# Patient Record
Sex: Male | Born: 1960 | Race: White | Hispanic: No | Marital: Married | Smoking: Current every day smoker
Health system: Southern US, Community
[De-identification: ages and names within clinical notes are randomized; demographics above are authoritative.]

## PROBLEM LIST (undated history)

## (undated) DIAGNOSIS — E785 Hyperlipidemia, unspecified: Secondary | ICD-10-CM

## (undated) DIAGNOSIS — F419 Anxiety disorder, unspecified: Secondary | ICD-10-CM

## (undated) DIAGNOSIS — F1910 Other psychoactive substance abuse, uncomplicated: Secondary | ICD-10-CM

## (undated) DIAGNOSIS — N529 Male erectile dysfunction, unspecified: Secondary | ICD-10-CM

## (undated) DIAGNOSIS — M549 Dorsalgia, unspecified: Secondary | ICD-10-CM

## (undated) DIAGNOSIS — E230 Hypopituitarism: Secondary | ICD-10-CM

## (undated) DIAGNOSIS — K219 Gastro-esophageal reflux disease without esophagitis: Secondary | ICD-10-CM

## (undated) DIAGNOSIS — M25559 Pain in unspecified hip: Secondary | ICD-10-CM

## (undated) DIAGNOSIS — J189 Pneumonia, unspecified organism: Secondary | ICD-10-CM

## (undated) DIAGNOSIS — E039 Hypothyroidism, unspecified: Secondary | ICD-10-CM

## (undated) DIAGNOSIS — M1990 Unspecified osteoarthritis, unspecified site: Secondary | ICD-10-CM

## (undated) DIAGNOSIS — M797 Fibromyalgia: Secondary | ICD-10-CM

## (undated) DIAGNOSIS — R519 Headache, unspecified: Secondary | ICD-10-CM

## (undated) HISTORY — PX: COLONOSCOPY: SHX174

## (undated) HISTORY — PX: FRACTURE SURGERY: SHX138

## (undated) HISTORY — PX: OTHER SURGICAL HISTORY: SHX169

## (undated) HISTORY — DX: Dorsalgia, unspecified: M54.9

## (undated) HISTORY — DX: Pain in unspecified hip: M25.559

## (undated) HISTORY — PX: APPENDECTOMY: SHX54

## (undated) HISTORY — DX: Hypothyroidism, unspecified: E03.9

## (undated) HISTORY — DX: Hyperlipidemia, unspecified: E78.5

## (undated) HISTORY — DX: Hypopituitarism: E23.0

## (undated) HISTORY — DX: Male erectile dysfunction, unspecified: N52.9

---

## 2007-10-25 ENCOUNTER — Encounter (INDEPENDENT_AMBULATORY_CARE_PROVIDER_SITE_OTHER): Payer: Self-pay | Admitting: *Deleted

## 2007-10-25 ENCOUNTER — Encounter: Payer: Self-pay | Admitting: Family Medicine

## 2007-10-25 LAB — CONVERTED CEMR LAB
AST: 12 units/L
Albumin: 1.6 g/dL
BUN: 12 mg/dL
Chloride, Serum: 106 mmol/L
Cortisol, Plasma: 26.3 ug/dL
Creatinine, Ser: 1.13 mg/dL
Free T4: 1.1 ng/dL
Globulin: 2.4 g/dL
HDL: 25 mg/dL
Potassium, serum: 4.2 mmol/L
TSH: 3.97 microintl units/mL
Testosterone, Free: 320.2 ng/dL
Total Bilirubin: 0.3 mg/dL
Total Protein: 6.3 g/dL

## 2008-02-04 ENCOUNTER — Ambulatory Visit: Payer: Self-pay | Admitting: Family Medicine

## 2008-02-04 DIAGNOSIS — M549 Dorsalgia, unspecified: Secondary | ICD-10-CM | POA: Insufficient documentation

## 2008-02-04 DIAGNOSIS — E039 Hypothyroidism, unspecified: Secondary | ICD-10-CM | POA: Insufficient documentation

## 2008-02-04 DIAGNOSIS — E236 Other disorders of pituitary gland: Secondary | ICD-10-CM | POA: Insufficient documentation

## 2008-02-04 DIAGNOSIS — R42 Dizziness and giddiness: Secondary | ICD-10-CM

## 2008-02-06 ENCOUNTER — Encounter (INDEPENDENT_AMBULATORY_CARE_PROVIDER_SITE_OTHER): Payer: Self-pay | Admitting: *Deleted

## 2008-02-07 ENCOUNTER — Encounter (INDEPENDENT_AMBULATORY_CARE_PROVIDER_SITE_OTHER): Payer: Self-pay | Admitting: *Deleted

## 2008-02-11 ENCOUNTER — Telehealth (INDEPENDENT_AMBULATORY_CARE_PROVIDER_SITE_OTHER): Payer: Self-pay | Admitting: *Deleted

## 2008-02-12 ENCOUNTER — Telehealth (INDEPENDENT_AMBULATORY_CARE_PROVIDER_SITE_OTHER): Payer: Self-pay | Admitting: *Deleted

## 2008-02-26 ENCOUNTER — Ambulatory Visit: Payer: Self-pay | Admitting: Family Medicine

## 2008-02-26 DIAGNOSIS — L049 Acute lymphadenitis, unspecified: Secondary | ICD-10-CM

## 2008-03-04 ENCOUNTER — Encounter (INDEPENDENT_AMBULATORY_CARE_PROVIDER_SITE_OTHER): Payer: Self-pay | Admitting: *Deleted

## 2008-03-11 ENCOUNTER — Ambulatory Visit: Payer: Self-pay | Admitting: Family Medicine

## 2008-03-11 ENCOUNTER — Ambulatory Visit: Payer: Self-pay | Admitting: Cardiology

## 2008-03-11 DIAGNOSIS — R221 Localized swelling, mass and lump, neck: Secondary | ICD-10-CM

## 2008-03-11 DIAGNOSIS — R220 Localized swelling, mass and lump, head: Secondary | ICD-10-CM | POA: Insufficient documentation

## 2008-03-12 ENCOUNTER — Telehealth (INDEPENDENT_AMBULATORY_CARE_PROVIDER_SITE_OTHER): Payer: Self-pay | Admitting: *Deleted

## 2008-03-12 LAB — CONVERTED CEMR LAB
Basophils Relative: 0.7 % (ref 0.0–3.0)
CO2: 34 meq/L — ABNORMAL HIGH (ref 19–32)
Eosinophils Absolute: 0.4 10*3/uL (ref 0.0–0.7)
GFR calc Af Amer: 83 mL/min
Hemoglobin: 15.2 g/dL (ref 13.0–17.0)
MCHC: 34.4 g/dL (ref 30.0–36.0)
Monocytes Absolute: 0.8 10*3/uL (ref 0.1–1.0)
Neutrophils Relative %: 57.9 % (ref 43.0–77.0)
RBC: 5.17 M/uL (ref 4.22–5.81)
RDW: 13.6 % (ref 11.5–14.6)
Sed Rate: 16 mm/hr (ref 0–16)
TSH: 2.1 microintl units/mL (ref 0.35–5.50)
WBC: 9.7 10*3/uL (ref 4.5–10.5)

## 2008-03-17 ENCOUNTER — Encounter: Payer: Self-pay | Admitting: Family Medicine

## 2008-03-19 ENCOUNTER — Encounter: Payer: Self-pay | Admitting: Family Medicine

## 2008-03-24 ENCOUNTER — Encounter: Payer: Self-pay | Admitting: Family Medicine

## 2008-04-03 ENCOUNTER — Ambulatory Visit: Payer: Self-pay | Admitting: Family Medicine

## 2008-04-03 DIAGNOSIS — F17200 Nicotine dependence, unspecified, uncomplicated: Secondary | ICD-10-CM | POA: Insufficient documentation

## 2008-05-01 ENCOUNTER — Telehealth (INDEPENDENT_AMBULATORY_CARE_PROVIDER_SITE_OTHER): Payer: Self-pay | Admitting: *Deleted

## 2008-06-17 ENCOUNTER — Encounter (INDEPENDENT_AMBULATORY_CARE_PROVIDER_SITE_OTHER): Payer: Self-pay | Admitting: *Deleted

## 2008-06-17 ENCOUNTER — Ambulatory Visit: Payer: Self-pay | Admitting: Family Medicine

## 2008-06-17 DIAGNOSIS — G4700 Insomnia, unspecified: Secondary | ICD-10-CM

## 2008-06-18 ENCOUNTER — Encounter (INDEPENDENT_AMBULATORY_CARE_PROVIDER_SITE_OTHER): Payer: Self-pay | Admitting: *Deleted

## 2008-06-18 LAB — CONVERTED CEMR LAB
ALT: 20 units/L (ref 0–53)
AST: 21 units/L (ref 0–37)
Albumin: 3.9 g/dL (ref 3.5–5.2)
BUN: 15 mg/dL (ref 6–23)
Basophils Absolute: 0 10*3/uL (ref 0.0–0.1)
Basophils Relative: 0.1 % (ref 0.0–3.0)
Bilirubin, Direct: 0 mg/dL (ref 0.0–0.3)
Calcium: 9.4 mg/dL (ref 8.4–10.5)
Chloride: 101 meq/L (ref 96–112)
Cholesterol: 228 mg/dL — ABNORMAL HIGH (ref 0–200)
Direct LDL: 154.7 mg/dL
Eosinophils Absolute: 0.5 10*3/uL (ref 0.0–0.7)
Eosinophils Relative: 5.3 % — ABNORMAL HIGH (ref 0.0–5.0)
Glucose, Bld: 97 mg/dL (ref 70–99)
HCT: 42.7 % (ref 39.0–52.0)
MCV: 84.5 fL (ref 78.0–100.0)
Monocytes Absolute: 1 10*3/uL (ref 0.1–1.0)
Monocytes Relative: 10.2 % (ref 3.0–12.0)
Neutrophils Relative %: 50 % (ref 43.0–77.0)
Platelets: 249 10*3/uL (ref 150.0–400.0)
Potassium: 4.7 meq/L (ref 3.5–5.1)
Total Bilirubin: 0.6 mg/dL (ref 0.3–1.2)
Total Protein: 6.9 g/dL (ref 6.0–8.3)
VLDL: 40 mg/dL (ref 0.0–40.0)

## 2008-07-02 ENCOUNTER — Ambulatory Visit: Payer: Self-pay | Admitting: Gastroenterology

## 2008-07-14 ENCOUNTER — Ambulatory Visit: Payer: Self-pay | Admitting: Gastroenterology

## 2008-07-17 ENCOUNTER — Ambulatory Visit: Payer: Self-pay | Admitting: Family Medicine

## 2008-07-17 DIAGNOSIS — R21 Rash and other nonspecific skin eruption: Secondary | ICD-10-CM

## 2008-07-17 DIAGNOSIS — IMO0001 Reserved for inherently not codable concepts without codable children: Secondary | ICD-10-CM

## 2008-07-17 DIAGNOSIS — F329 Major depressive disorder, single episode, unspecified: Secondary | ICD-10-CM

## 2008-08-15 ENCOUNTER — Ambulatory Visit: Payer: Self-pay | Admitting: Family Medicine

## 2008-08-15 DIAGNOSIS — M2550 Pain in unspecified joint: Secondary | ICD-10-CM | POA: Insufficient documentation

## 2008-08-15 DIAGNOSIS — F528 Other sexual dysfunction not due to a substance or known physiological condition: Secondary | ICD-10-CM

## 2008-08-19 ENCOUNTER — Telehealth (INDEPENDENT_AMBULATORY_CARE_PROVIDER_SITE_OTHER): Payer: Self-pay | Admitting: *Deleted

## 2008-08-22 ENCOUNTER — Encounter: Payer: Self-pay | Admitting: Family Medicine

## 2008-09-22 ENCOUNTER — Emergency Department (HOSPITAL_COMMUNITY): Admission: EM | Admit: 2008-09-22 | Discharge: 2008-09-22 | Payer: Self-pay | Admitting: Emergency Medicine

## 2008-10-20 ENCOUNTER — Encounter: Payer: Self-pay | Admitting: Family Medicine

## 2008-11-17 ENCOUNTER — Ambulatory Visit: Payer: Self-pay | Admitting: Family Medicine

## 2008-11-18 ENCOUNTER — Telehealth: Payer: Self-pay | Admitting: Family Medicine

## 2008-11-21 ENCOUNTER — Telehealth: Payer: Self-pay | Admitting: Family Medicine

## 2008-12-19 ENCOUNTER — Encounter: Payer: Self-pay | Admitting: Family Medicine

## 2008-12-23 ENCOUNTER — Ambulatory Visit: Payer: Self-pay | Admitting: Internal Medicine

## 2008-12-30 ENCOUNTER — Encounter: Payer: Self-pay | Admitting: Internal Medicine

## 2008-12-30 ENCOUNTER — Encounter: Admission: RE | Admit: 2008-12-30 | Discharge: 2008-12-30 | Payer: Self-pay | Admitting: General Surgery

## 2009-01-07 ENCOUNTER — Encounter: Payer: Self-pay | Admitting: Family Medicine

## 2009-04-22 ENCOUNTER — Encounter (INDEPENDENT_AMBULATORY_CARE_PROVIDER_SITE_OTHER): Payer: Self-pay | Admitting: *Deleted

## 2009-04-22 ENCOUNTER — Ambulatory Visit: Payer: Self-pay | Admitting: Internal Medicine

## 2009-07-06 ENCOUNTER — Telehealth (INDEPENDENT_AMBULATORY_CARE_PROVIDER_SITE_OTHER): Payer: Self-pay | Admitting: *Deleted

## 2009-07-08 ENCOUNTER — Ambulatory Visit: Payer: Self-pay | Admitting: Family Medicine

## 2009-07-09 ENCOUNTER — Telehealth (INDEPENDENT_AMBULATORY_CARE_PROVIDER_SITE_OTHER): Payer: Self-pay | Admitting: *Deleted

## 2009-07-09 LAB — CONVERTED CEMR LAB
ALT: 16 units/L (ref 0–53)
Alkaline Phosphatase: 69 units/L (ref 39–117)
Bilirubin, Direct: 0 mg/dL (ref 0.0–0.3)
Chloride: 104 meq/L (ref 96–112)
Cholesterol: 228 mg/dL — ABNORMAL HIGH (ref 0–200)
Creatinine, Ser: 1.2 mg/dL (ref 0.4–1.5)
Eosinophils Absolute: 0.1 10*3/uL (ref 0.0–0.7)
Glucose, Bld: 84 mg/dL (ref 70–99)
HCT: 44.6 % (ref 39.0–52.0)
HDL: 27.9 mg/dL — ABNORMAL LOW (ref >39.00)
Lymphocytes Relative: 30.5 % (ref 12.0–46.0)
Neutro Abs: 6.4 10*3/uL (ref 1.4–7.7)
PSA: 1.63 ng/mL (ref 0.10–4.00)
RDW: 14.5 % (ref 11.5–14.6)
Total Bilirubin: 0.4 mg/dL (ref 0.3–1.2)
Total CHOL/HDL Ratio: 8
Total Protein: 6.9 g/dL (ref 6.0–8.3)
Triglycerides: 256 mg/dL — ABNORMAL HIGH (ref 0.0–149.0)
VLDL: 51.2 mg/dL — ABNORMAL HIGH (ref 0.0–40.0)
WBC: 10.2 10*3/uL (ref 4.5–10.5)

## 2009-08-04 ENCOUNTER — Encounter: Admission: RE | Admit: 2009-08-04 | Discharge: 2009-08-04 | Payer: Self-pay | Admitting: Anesthesiology

## 2009-08-04 ENCOUNTER — Encounter: Payer: Self-pay | Admitting: Family Medicine

## 2009-08-12 ENCOUNTER — Ambulatory Visit: Payer: Self-pay | Admitting: Family Medicine

## 2009-08-12 DIAGNOSIS — E785 Hyperlipidemia, unspecified: Secondary | ICD-10-CM

## 2009-08-27 ENCOUNTER — Telehealth (INDEPENDENT_AMBULATORY_CARE_PROVIDER_SITE_OTHER): Payer: Self-pay | Admitting: *Deleted

## 2009-11-19 ENCOUNTER — Encounter: Payer: Self-pay | Admitting: Family Medicine

## 2009-11-20 ENCOUNTER — Encounter: Payer: Self-pay | Admitting: Family Medicine

## 2010-03-23 NOTE — Progress Notes (Signed)
Summary: labs   Phone Note Outgoing Call   Call placed by: Doristine Devoid,  Jul 09, 2009 4:15 PM Call placed to: Patient Summary of Call: Pt's total cholesterol, LDL, triglycerides all elevated.  HDL is low.  needs to start Simvastatin 40mg  nightly and recheck LFTs in 6-8 weeks.   Follow-up for Phone Call        spoke w/ patient aware of labs and recommendations say he would like to have a chance to work on diet and exercise first informed him that I would send out cholesterol information to help him and we will see where he is at in 6 months........Marland KitchenDoristine Devoid  Jul 09, 2009 4:22 PM

## 2010-03-23 NOTE — Progress Notes (Signed)
Summary: due rov  Phone Note Outgoing Call Call back at [facility] Phone [phone]   Summary of Call: I refilled his cymbalta x 1 month only -- needs rov with Dr. Beverely Low.........Marland KitchenShary Decamp  Jul 06, 2009 9:24 AM   Follow-up for Phone Call        Patient is coming in on 5.18.11 Follow-up by: Harold Barban,  Jul 06, 2009 10:15 AM

## 2010-03-23 NOTE — Letter (Signed)
Summary: [place] No Show Letter  [facility] [place]  [address] [place], [place] [address]   Phone: [phone]  Fax: [phone]    04/22/2009 MRN: [id number]  [facility] [address] [place], [place]  [address]   Dear Mr. Dillon Singh,   Our records indicate that you missed your scheduled appointment with Dr Drue Novel on 04/22/09.  Please contact this office to reschedule your appointment as soon as possible.  It is important that you keep your scheduled appointments with your physician, so we can provide you the best care possible.  Please be advised that there may be a charge for "no show" appointments.    Sincerely,   [place] [facility]

## 2010-03-23 NOTE — Assessment & Plan Note (Signed)
Summary: cpx/cbs   Vital Signs:  Patient profile:   50 year old male Height:      72 inches Weight:      192 pounds BMI:     26.13 BSA:     2.10 Pulse rate:   76 / minute Pulse rhythm:   regular BP sitting:   128 / 74  (left arm) Cuff size:   regular  Vitals Entered By: Ok Anis CMA (August 12, 2009 8:24 AM) CC: CPX Comments Eye Exam Done six or eight weeks ago    History of Present Illness: 50 yo man here today for CPE.  1) Hyperlipidemia- has not started statin.  is in donut hole, fears he can't afford it.  'my cholesterol has always been high'.  aware of risk of MI/CVA  2) back/neck pain- had MRI last week, complains that L leg is 'completely numb'.  has seen multiple specialists and 'no one has answers'.  has been to [facility], docs in [place], [facility].  not sure what to do- very discouraged.  3) Tobacco use- still smoking 1 ppd.  aware he needs to quit but not interested at this time.  Preventive Screening-Counseling & Management  Alcohol-Tobacco     Alcohol drinks/day: 0     Smoking Status: current     Smoking Cessation Counseling: yes     Smoke Cessation Stage: contemplative     Packs/Day: 1.0  Caffeine-Diet-Exercise     Does Patient Exercise: no      Sexual History:  currently monogamous.        Drug Use:  never.    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 Take 6 Tablets Daily 2)  Cymbalta 60 Mg Cpep (Duloxetine Hcl) .... Take Two Tablets Daily 3)  Advil 200 Mg Tabs (Ibuprofen) .... As Needed 4)  Androgel Pump 1 % Gel (Testosterone) .... Use Twice Daily 5)  Soma 350 Mg Tabs (Carisoprodol) .Marland Kitchen.. 1 Tab By Mouth Three Times A Day 6)  Cialis 10 Mg Tabs (Tadalafil) .Marland Kitchen.. 1 Tab By Mouth As Needed.  Disp 1 Month Supply 7)  Halobetasol Propionate 0.05 % Crea (Halobetasol Propionate) .... Daily 8)  Levothyroxine Sodium 100 Mcg Tabs (Levothyroxine Sodium) .... Take One Tablet Daily 9)  Pravastatin Sodium 40 Mg  Tabs (Pravastatin Sodium) .... 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  The patient denies anorexia, fever, weight loss, weight gain, vision loss, decreased hearing, hoarseness, chest pain, syncope, dyspnea on exertion, peripheral edema, prolonged cough, headaches, abdominal pain, melena, hematochezia, severe indigestion/heartburn, hematuria, suspicious skin lesions, abnormal bleeding, enlarged lymph nodes, and testicular masses.    Physical Exam  General:  alert and well-developed.   Head:  Normocephalic and atraumatic without obvious abnormalities. No apparent alopecia or balding.  Eyes:  No corneal  or conjunctival inflammation noted. EOMI. Perrla. Funduscopic exam benign, without hemorrhages, exudates or papilledema. Vision grossly normal. Ears:  External ear exam shows no significant lesions or deformities.  Otoscopic examination reveals clear canals, tympanic membranes are intact bilaterally without bulging, retraction, inflammation or discharge. Hearing is grossly normal bilaterally. Nose:  External nasal examination shows no deformity or inflammation. Nasal mucosa are pink and moist without lesions or exudates. Mouth:  Oral mucosa and oropharynx without lesions or exudates.  Teeth in good repair. Neck:  No deformities, masses, or tenderness noted. Lungs:  Normal respiratory effort, chest expands symmetrically. Lungs are clear to auscultation, no crackles or wheezes. Heart:  Normal rate and regular rhythm. S1 and S2 normal without gallop, murmur, click, rub or other extra sounds. Abdomen:  Bowel sounds positive,abdomen soft and non-tender without masses, organomegaly or hernias noted. Rectal:  deferred at pt's request Genitalia:  Testes bilaterally descended without nodularity, tenderness or masses. No scrotal masses or lesions. No penis lesions or urethral discharge. Prostate:  deferred at pt's request Pulses:  +2 carotid, radial, DP Extremities:  No clubbing, cyanosis, edema, or deformity noted with normal full range of motion of all joints.   Neurologic:  No cranial nerve deficits noted. Station and gait are normal. Plantar reflexes are down-going bilaterally. DTRs are symmetrical throughout. motor and coordinative functions appear intact. Skin:  + erythema and hyperpigmentation of anterior shins bilaterally Cervical Nodes:  No lymphadenopathy noted Inguinal Nodes:  No significant adenopathy Psych:  Oriented X3, flat affect, and subdued.     Impression & Recommendations:  Problem # 1:  HEALTHY ADULT MALE (ICD-V70.0) Assessment Unchanged pt's PE WNL.  labs collected at previous  visit and reviewed w/ pt.  anticipatory guidance provided.  Problem # 2:  HYPERLIPIDEMIA (ICD-272.4) Assessment: New pt had not started simvastatin, feared he couldn't afford it.  start $4 pravastatin.  recheck LFTs in 6-8 weeks. His updated medication list for this problem includes:    Pravastatin Sodium 40 Mg Tabs (Pravastatin sodium) ..... One by mouth at bedtime  Problem # 3:  BACK PAIN, CHRONIC (ICD-724.5) Assessment: Unchanged offered pt referral to [facility] for another opinion on his back pain and current situation.  pt very frustrated that 'no one can find what's wrong w/ me'.  pt not interested in referral at this time b/c 'i can't take another person saying no'.  will let me know if he changes his mind. The following medications were removed from the medication list:    Fentanyl 75 Mcg/hr Pt72 (Fentanyl) .Marland Kitchen... Change every 2 days His updated medication list for this problem includes:    Oxycodone Hcl 30 Mg Tabs (Oxycodone hcl) .Marland Kitchen... Take 6 tablets daily    Advil 200 Mg Tabs (Ibuprofen) .Marland Kitchen... As needed    Soma 350 Mg Tabs (Carisoprodol) .Marland Kitchen... 1 tab by mouth three times a day  Problem # 4:  TOBACCO USE (ICD-305.1) Assessment: Unchanged strongly encouraged pt to quit.  will follow.  Complete  Medication List: 1)  Oxycodone Hcl 30 Mg Tabs (Oxycodone hcl) .... Take 6 tablets daily 2)  Cymbalta 60 Mg Cpep (Duloxetine hcl) .... Take two tablets daily 3)  Advil 200 Mg Tabs (Ibuprofen) .... As needed 4)  Androgel Pump 1 % Gel (Testosterone) .... Use twice daily 5)  Soma 350 Mg Tabs (Carisoprodol) .Marland Kitchen.. 1 tab by mouth three times a day 6)  Cialis 10 Mg Tabs (Tadalafil) .Marland Kitchen.. 1 tab by mouth as needed.  disp 1 month supply 7)  Halobetasol Propionate 0.05 % Crea (Halobetasol propionate) .... Daily 8)  Levothyroxine Sodium 100 Mcg Tabs (Levothyroxine sodium) .... Take one tablet daily 9)  Pravastatin Sodium 40 Mg Tabs (Pravastatin sodium) .... One by mouth at bedtime  Patient  Instructions: 1)  Please schedule a lab visit in 6-8 weeks to check your liver function 2)  Start the Pravastatin nightly before bed 3)  Let me know what you decide about [facility]- we can refer at any time 4)  Call with any questions or concerns 5)  Hang in there!! Prescriptions: PRAVASTATIN SODIUM 40 MG  TABS (PRAVASTATIN SODIUM) one by mouth at bedtime  #30 x 3   Entered and Authorized by:   Neena Rhymes MD   Signed by:   Neena Rhymes MD on 08/12/2009   Method used:   Print then Give to Patient   RxID:   [id number]

## 2010-03-23 NOTE — Miscellaneous (Signed)
Summary: Flu Vaccine / [facility]  Flu Vaccine / [facility]   Imported By: Lennie Odor 11/27/2009 12:07:19  _____________________________________________________________________  External Attachment:    Type:   Image     Comment:   External Document

## 2010-03-23 NOTE — Miscellaneous (Signed)
Summary: Flu vacc documentation  Clinical Lists Changes  Observations: Added new observation of FLU VAX: Historical (11/19/2009 9:20)      Immunization History:  Influenza Immunization History:    Influenza:  historical (11/19/2009) Flu vacc was given at [facility] on 11-19-09. Lucious Groves CMA  November 20, 2009 9:20 AM

## 2010-03-23 NOTE — Assessment & Plan Note (Signed)
Summary: roa from refill//[facility]   Vital Signs:  Patient profile:   50 year old male Height:      72 inches Weight:      191 pounds BMI:     26.00 Pulse rate:   70 / minute BP sitting:   118 / 80  (left arm)  Vitals Entered By: Doristine Devoid (Jul 08, 2009 11:03 AM) CC: roa and refill on meds    History of Present Illness: 50 yo man here today for f/u on  1) Hypothyroid- taking Levothyroxine daily.  following w/ Dr Talmage Nap (appt next month).  2) Depression- not in counseling, spends his days alone.  daughter is moving from [facility].  needs cymbalta refill.  denies SI/HI.  admits to concerns over what his constant pain is doing to his relationship w/ his wife.  3) ED- need Cialis refill.  meds still working for pt, gets occasional HAs.  4) Health maintainence- overdue for CPE.  needs labs.  Current Medications (verified): 1)  Oxycodone Hcl 30 Mg Tabs (Oxycodone Hcl) .... Take 6 Tablets Daily 2)  Cymbalta 60 Mg Cpep (Duloxetine Hcl) .... Take Two Tablets Daily 3)  Ibuprofen 600 Mg Tabs (Ibuprofen) .... Take As Needed 4)  Androgel Pump 1 % Gel (Testosterone) .... Use Twice Daily 5)  Soma 350 Mg Tabs (Carisoprodol) .Marland Kitchen.. 1 Tab By Mouth Three Times A Day 6)  Vitamin D 29528 Unit Caps (Ergocalciferol) .... Take One Tablet Weekly 7)  Cialis 10 Mg Tabs (Tadalafil) .Marland Kitchen.. 1 Tab By Mouth As Needed.  Disp 1 Month Supply 8)  Fentanyl 75 Mcg/hr Pt72 (Fentanyl) .... Change Every 2 Days 9)  Halobetasol Propionate 0.05 % Crea (Halobetasol Propionate) .... Daily 10)  Levothyroxine Sodium 100 Mcg Tabs (Levothyroxine Sodium) .... Take One Tablet Daily  Allergies (verified): No Known Drug Allergies  Past History:  Past Medical History: Last updated: 08/15/2008 Hypothyroidism Back Pain  Hip Pain hypopituitarism ED  Review of Systems      See HPI  Physical Exam  General:  alert and well-developed.   Psych:  Oriented X3, flat affect, and subdued.     Impression &  Recommendations:  Problem # 1:  HYPOTHYROIDISM (ICD-244.9) Assessment Unchanged following w/ Dr Talmage Nap.  requested notes. His updated medication list for this problem includes:    Levothyroxine Sodium 100 Mcg Tabs (Levothyroxine sodium) .Marland Kitchen... Take one tablet daily  Problem # 2:  DEPRESSIVE DISORDER (ICD-311) Assessment: Unchanged again stressed the importance of counseling- especially if pt feels he's burdening family.  denies SI/HI.  refill cymbalta, #s of therapists given. His updated medication list for this problem includes:    Cymbalta 60 Mg Cpep (Duloxetine hcl) .Marland Kitchen... Take two tablets daily  Problem # 3:  ERECTILE DYSFUNCTION (ICD-302.72) Assessment: Unchanged samples and refill given His updated medication list for this problem includes:    Cialis 10 Mg Tabs (Tadalafil) .Marland Kitchen... 1 tab by mouth as needed.  disp 1 month supply  Problem # 4:  HEALTHY ADULT MALE (ICD-V70.0) Assessment: Unchanged labs collected for upcoming CPE. Orders: Venipuncture (41324) TLB-Lipid Panel (80061-LIPID) TLB-BMP (Basic Metabolic Panel-BMET) (80048-METABOL) TLB-CBC Platelet - w/Differential (85025-CBCD) TLB-Hepatic/Liver Function Pnl (80076-HEPATIC) TLB-TSH (Thyroid Stimulating Hormone) (84443-TSH) TLB-PSA (Prostate Specific Antigen) (84153-PSA)  Complete Medication List: 1)  Oxycodone Hcl 30 Mg Tabs (Oxycodone hcl) .... Take 6 tablets daily 2)  Cymbalta 60 Mg Cpep (Duloxetine hcl) .... Take two tablets daily 3)  Ibuprofen 600 Mg Tabs (Ibuprofen) .... Take as needed 4)  Androgel Pump 1 %  Gel (Testosterone) .... Use twice daily 5)  Soma 350 Mg Tabs (Carisoprodol) .Marland Kitchen.. 1 tab by mouth three times a day 6)  Vitamin D 60454 Unit Caps (Ergocalciferol) .... Take one tablet weekly 7)  Cialis 10 Mg Tabs (Tadalafil) .Marland Kitchen.. 1 tab by mouth as needed.  disp 1 month supply 8)  Fentanyl 75 Mcg/hr Pt72 (Fentanyl) .... Change every 2 days 9)  Halobetasol Propionate 0.05 % Crea (Halobetasol propionate) ....  Daily 10)  Levothyroxine Sodium 100 Mcg Tabs (Levothyroxine sodium) .... Take one tablet daily  Patient Instructions: 1)  Please schedule your complete physical in the next 2-4 weeks, you can eat before this 2)  Consider calling and setting up counseling 3)  Please have Dr Talmage Nap send me a copy of her note 4)  Call with any questions or concerns 5)  Hang in there! Prescriptions: CIALIS 10 MG TABS (TADALAFIL) 1 tab by mouth as needed.  disp 1 month supply  #6 x 3   Entered and Authorized by:   Neena Rhymes MD   Signed by:   Neena Rhymes MD on 07/08/2009   Method used:   Electronically to        [phone]* (retail)       [address] [place], [place]  [address]       Ph: [phone]       Fax: [phone]   RxID:   [id number] CYMBALTA 60 MG CPEP (DULOXETINE HCL) take two tablets daily  #60 x 6   Entered and Authorized by:   Neena Rhymes MD   Signed by:   Neena Rhymes MD on 07/08/2009   Method used:   Electronically to        [phone]* (retail)       [address] [place], [place]  [address]       Ph: [phone]       Fax: [phone]   RxID:   [id number]

## 2010-03-23 NOTE — Progress Notes (Signed)
Summary: S/E of Pravastatin  Phone Note Call from Patient Call back at Home Phone [phone]   Caller: Patient Summary of Call: Patient called and left a VM on the triage line. He said he was started on Pravastatin and he is now experiencing headaches. He would like some advice on what to do. Please advise and send relpy to [facility] or nurse. Thanks! Initial call taken by: Harold Barban,  August 27, 2009 5:04 PM  Follow-up for Phone Call        HA is not a common side effect of pravastatin- may not be the cause of his HAs.  HAs could be allergy related, tension, migraine, etc.  can take 2-3 day break from med and see if it's related- if so, will need to switch meds, if not- will need appt to discuss HAs. Follow-up by: Neena Rhymes MD,  August 28, 2009 8:00 AM  Additional Follow-up for Phone Call Additional follow up Details #1::        spoke w/ patient aware of recommendations and will f/u to let us know how he is doing.......Marland KitchenDoristine Devoid  August 28, 2009 3:57 PM

## 2010-03-26 ENCOUNTER — Telehealth (INDEPENDENT_AMBULATORY_CARE_PROVIDER_SITE_OTHER): Payer: Self-pay | Admitting: *Deleted

## 2010-03-31 NOTE — Progress Notes (Signed)
Summary: Refill Request  Phone Note Refill Request Call back at [phone] Message from:  Pharmacy on March 26, 2010 11:15 AM  Refills Requested: Medication #1:  CYMBALTA 60 MG CPEP take two tablets daily   Dosage confirmed as above?Dosage Confirmed   Supply Requested: 60   Last Refilled: 02/21/2010   Notes: [facility]  Next Appointment Scheduled: none Initial call taken by: Harold Barban,  March 26, 2010 11:16 AM    Prescriptions: CYMBALTA 60 MG CPEP (DULOXETINE HCL) take two tablets daily  #60 x 6   Entered by:   Doristine Devoid CMA   Authorized by:   Neena Rhymes MD   Signed by:   Doristine Devoid CMA on 03/26/2010   Method used:   [facility]        [facility] [phone][facility] (retail)       [address] [place], [place]  [address]       Ph: [phone]       Fax: [phone]   RxID:   [id number]

## 2010-09-08 ENCOUNTER — Encounter: Payer: Self-pay | Admitting: Family Medicine

## 2010-09-13 ENCOUNTER — Ambulatory Visit (INDEPENDENT_AMBULATORY_CARE_PROVIDER_SITE_OTHER): Payer: 59 | Admitting: Family Medicine

## 2010-09-13 ENCOUNTER — Encounter: Payer: Self-pay | Admitting: Family Medicine

## 2010-09-13 DIAGNOSIS — L989 Disorder of the skin and subcutaneous tissue, unspecified: Secondary | ICD-10-CM

## 2010-09-13 DIAGNOSIS — F3289 Other specified depressive episodes: Secondary | ICD-10-CM

## 2010-09-13 DIAGNOSIS — F329 Major depressive disorder, single episode, unspecified: Secondary | ICD-10-CM

## 2010-09-13 DIAGNOSIS — E785 Hyperlipidemia, unspecified: Secondary | ICD-10-CM

## 2010-09-13 MED ORDER — VENLAFAXINE HCL ER 150 MG PO TB24: 1.0000 | ORAL_TABLET | Freq: Every day | ORAL | Status: DC

## 2010-09-13 NOTE — Assessment & Plan Note (Signed)
Pt has been off cholesterol med for ~1 yr due to headaches.  This is not typical side effect of statin.  Rather than restarting med will have pt scheduled CPE, check labs, and determine amount of reduction needed.  Pt expressed understanding and is in agreement w/ plan.

## 2010-09-13 NOTE — Assessment & Plan Note (Signed)
Pt's skin lesions are all benign appearing at this time.  Will continue to follow at future visits.  Reviewed abnormalities pt should be watchful of- change in shape, size, shading.  Pt aware.

## 2010-09-13 NOTE — Progress Notes (Signed)
  Subjective:    Patient ID: Dillon Singh, male    DOB: 11-01-1960, 50 y.o.   MRN: [id number]  HPI Hyperlipidemia- chronic problem for pt, was on Pravastatin for 2-3 weeks and noticed increased HAs.  Last took meds 1 yr ago.  Due for CPE.  Depression- has been on Cymbalta for 4-5 yrs, works well but is currently in the donut hole and meds cost $340.  Insurance is recommending switch to Effexor.  Moles- has 3 moles under L eye that are slowly raising.  Also has dark sports on ears bilaterally.  Worked in sun for many years.  Concerned about possible skin cancer   Review of Systems For ROS see HPI     Objective:   Physical Exam  Constitutional: He appears well-developed and well-nourished. No distress.  Cardiovascular: Normal rate, regular rhythm, normal heart sounds and intact distal pulses.   Pulmonary/Chest: Effort normal and breath sounds normal. No respiratory distress. He has no wheezes. He has no rales.  Skin: Skin is warm and dry.       Multiple smooth 'sun spots' (hyperpigmented areas) on external ears bilaterally 3 small nevi under L eye, none abnormally pigmented or shaped- none w/ melanotic features          Assessment & Plan:

## 2010-09-13 NOTE — Assessment & Plan Note (Signed)
Will attempt to switch pt from Cymbalta to Effexor due to insurance coverage.  If pt's sxs are not well controlled after switching, he's to call and we will attempt to get med via pt assistance.  Pt expressed understanding and is in agreement w/ plan.

## 2010-09-13 NOTE — Patient Instructions (Signed)
Please schedule your complete physical at your convenience- do not eat before this appt Your moles look fine but we will continue to monitor them Switch from the Cymbalta to the Effexor.  Call me if it is not effective and we'll figure something out. Call with any questions or concerns Have a great summer!

## 2011-01-26 ENCOUNTER — Ambulatory Visit: Payer: 59 | Attending: Anesthesiology | Admitting: Physical Therapy

## 2011-01-26 DIAGNOSIS — M25559 Pain in unspecified hip: Secondary | ICD-10-CM | POA: Insufficient documentation

## 2011-01-26 DIAGNOSIS — IMO0001 Reserved for inherently not codable concepts without codable children: Secondary | ICD-10-CM | POA: Insufficient documentation

## 2011-01-26 DIAGNOSIS — M545 Low back pain, unspecified: Secondary | ICD-10-CM | POA: Insufficient documentation

## 2011-01-26 DIAGNOSIS — M542 Cervicalgia: Secondary | ICD-10-CM | POA: Insufficient documentation

## 2011-02-02 ENCOUNTER — Ambulatory Visit: Payer: 59 | Admitting: Physical Therapy

## 2011-02-04 ENCOUNTER — Ambulatory Visit: Payer: 59 | Admitting: Physical Therapy

## 2011-02-07 ENCOUNTER — Ambulatory Visit: Payer: 59 | Admitting: Physical Therapy

## 2011-02-10 ENCOUNTER — Ambulatory Visit: Payer: 59 | Admitting: Physical Therapy

## 2011-02-16 ENCOUNTER — Ambulatory Visit: Payer: 59 | Admitting: Physical Therapy

## 2011-03-14 ENCOUNTER — Ambulatory Visit (INDEPENDENT_AMBULATORY_CARE_PROVIDER_SITE_OTHER): Payer: Medicare Other | Admitting: Family Medicine

## 2011-03-14 ENCOUNTER — Encounter: Payer: Self-pay | Admitting: Family Medicine

## 2011-03-14 VITALS — BP 120/76 | HR 88 | Temp 98.8°F | Wt 195.8 lb

## 2011-03-14 DIAGNOSIS — R52 Pain, unspecified: Secondary | ICD-10-CM

## 2011-03-14 DIAGNOSIS — E291 Testicular hypofunction: Secondary | ICD-10-CM

## 2011-03-14 DIAGNOSIS — D497 Neoplasm of unspecified behavior of endocrine glands and other parts of nervous system: Secondary | ICD-10-CM

## 2011-03-14 DIAGNOSIS — IMO0001 Reserved for inherently not codable concepts without codable children: Secondary | ICD-10-CM

## 2011-03-14 DIAGNOSIS — M791 Myalgia, unspecified site: Secondary | ICD-10-CM

## 2011-03-14 NOTE — Patient Instructions (Signed)
Myalgia, Adult Myalgia is the medical term for muscle pain. It is a symptom of many things. Nearly everyone at some time in their life has this. The most common cause for muscle pain is overuse or straining and more so when you are not in shape. Injuries and muscle bruises cause myalgias. Muscle pain without a history of injury can also be caused by a virus. It frequently comes along with the flu. Myalgia not caused by muscle strain can be present in a large number of infectious diseases. Some autoimmune diseases like lupus and fibromyalgia can cause muscle pain. Myalgia may be mild, or severe. SYMPTOMS  The symptoms of myalgia are simply muscle pain. Most of the time this is short lived and the pain goes away without treatment. DIAGNOSIS  Myalgia is diagnosed by your caregiver by taking your history. This means you tell him when the problems began, what they are, and what has been happening. If this has not been a long term problem, your caregiver may want to watch for a while to see what will happen. If it has been long term, they may want to do additional testing. TREATMENT  The treatment depends on what the underlying cause of the muscle pain is. Often anti-inflammatory medications will help. HOME CARE INSTRUCTIONS  If the pain in your muscles came from overuse, slow down your activities until the problems go away.   Myalgia from overuse of a muscle can be treated with alternating hot and cold packs on the muscle affected or with cold for the first couple days. If either heat or cold seems to make things worse, stop their use.   Apply ice to the sore area for 15 to 20 minutes, 3 to 4 times per day, while awake for the first 2 days of muscle soreness, or as directed. Put the ice in a plastic bag and place a towel between the bag of ice and your skin.   Only take over-the-counter or prescription medicines for pain, discomfort, or fever as directed by your caregiver.   Regular gentle exercise may  help if you are not active.   Stretching before strenuous exercise can help lower the risk of myalgia. It is normal when beginning an exercise regimen to feel some muscle pain after exercising. Muscles that have not been used frequently will be sore at first. If the pain is extreme, this may mean injury to a muscle.  SEEK MEDICAL CARE IF:  You have an increase in muscle pain that is not relieved with medication.   You begin to run a temperature.   You develop nausea and vomiting.   You develop a stiff and painful neck.   You develop a rash.   You develop muscle pain after a tick bite.   You have continued muscle pain while working out even after you are in good condition.  SEEK IMMEDIATE MEDICAL CARE IF: Any of your problems are getting worse and medications are not helping. MAKE SURE YOU:   Understand these instructions.   Will watch your condition.   Will get help right away if you are not doing well or get worse.  Document Released: 12/30/2005 Document Revised: 10/20/2010 Document Reviewed: 03/21/2006 [employer] Patient Information 2012 [employer]. 

## 2011-03-14 NOTE — Progress Notes (Signed)
  Subjective:    Patient ID: Dillon Singh, male    DOB: [birth year], 51 y.o.   MRN: [id number]  HPI Pt here c/o body aches for a long time ,  Scratchy throat.  No congestion, fever or chills.   Pt with hx parathyroid tumor  And low thyroid.  No other complaints.    Review of Systems As above    Objective:   Physical Exam  Constitutional: He is oriented to person, place, and time. He appears well-developed and well-nourished.  HENT:  Head: Normocephalic and atraumatic.  Right Ear: External ear normal.  Left Ear: External ear normal.  Neck: Normal range of motion. Neck supple.  Cardiovascular: Normal rate, regular rhythm, normal heart sounds and intact distal pulses.   No murmur heard. Pulmonary/Chest: Effort normal and breath sounds normal. No respiratory distress. He has no wheezes. He has no rales. He exhibits no tenderness.  Musculoskeletal: Normal range of motion. He exhibits no edema and no tenderness.  Neurological: He is alert and oriented to person, place, and time.  Psychiatric: He has a normal mood and affect. His behavior is normal. Judgment and thought content normal.          Assessment & Plan:  bodyaches----hold pravastatin for 2 weeks                           Check labs                      Keep cpe appointment with Dr Beverely Low

## 2011-03-15 LAB — TESTOSTERONE, FREE, TOTAL, SHBG
Testosterone, Free: 26.6 pg/mL — ABNORMAL LOW (ref 47.0–244.0)
Testosterone-% Free: 1.4 % — ABNORMAL LOW (ref 1.6–2.9)
Testosterone: 196.68 ng/dL — ABNORMAL LOW (ref 250–890)

## 2011-03-15 LAB — CBC WITH DIFFERENTIAL/PLATELET
Basophils Absolute: 0.2 10*3/uL — ABNORMAL HIGH (ref 0.0–0.1)
Basophils Relative: 2.1 % (ref 0.0–3.0)
Eosinophils Absolute: 0.6 10*3/uL (ref 0.0–0.7)
Eosinophils Relative: 7.5 % — ABNORMAL HIGH (ref 0.0–5.0)
Hemoglobin: 14.6 g/dL (ref 13.0–17.0)
Neutro Abs: 1.8 10*3/uL (ref 1.4–7.7)
WBC: 8.3 10*3/uL (ref 4.5–10.5)

## 2011-03-15 LAB — SEDIMENTATION RATE: Sed Rate: 67 mm/hr — ABNORMAL HIGH (ref 0–22)

## 2011-03-15 LAB — HEPATIC FUNCTION PANEL
AST: 54 U/L — ABNORMAL HIGH (ref 0–37)
Albumin: 3.6 g/dL (ref 3.5–5.2)
Alkaline Phosphatase: 67 U/L (ref 39–117)
Bilirubin, Direct: 0.1 mg/dL (ref 0.0–0.3)
Total Protein: 7.2 g/dL (ref 6.0–8.3)

## 2011-03-15 LAB — ANA: Anti Nuclear Antibody(ANA): NEGATIVE

## 2011-03-15 LAB — PTH, INTACT AND CALCIUM
Calcium, Total (PTH): 9.3 mg/dL (ref 8.4–10.5)
PTH: 39.5 pg/mL (ref 14.0–72.0)

## 2011-03-15 LAB — TSH: TSH: 0.45 u[IU]/mL (ref 0.35–5.50)

## 2011-03-16 ENCOUNTER — Other Ambulatory Visit: Payer: Medicare Other

## 2011-03-17 LAB — VITAMIN D 1,25 DIHYDROXY
Vitamin D 1, 25 (OH)2 Total: 34 pg/mL (ref 18–72)
Vitamin D3 1, 25 (OH)2: 34 pg/mL

## 2011-03-18 ENCOUNTER — Other Ambulatory Visit (INDEPENDENT_AMBULATORY_CARE_PROVIDER_SITE_OTHER): Payer: Medicare Other

## 2011-03-18 DIAGNOSIS — Z79899 Other long term (current) drug therapy: Secondary | ICD-10-CM

## 2011-03-18 DIAGNOSIS — Z0000 Encounter for general adult medical examination without abnormal findings: Secondary | ICD-10-CM

## 2011-03-18 LAB — HEPATIC FUNCTION PANEL
AST: 14 U/L (ref 0–37)
Total Bilirubin: 0.5 mg/dL (ref 0.3–1.2)
Total Protein: 7.1 g/dL (ref 6.0–8.3)

## 2011-03-18 LAB — GAMMA GT: GGT: 44 U/L (ref 7–51)

## 2011-03-21 ENCOUNTER — Encounter: Payer: Self-pay | Admitting: *Deleted

## 2011-03-30 ENCOUNTER — Ambulatory Visit (INDEPENDENT_AMBULATORY_CARE_PROVIDER_SITE_OTHER): Payer: Medicare Other | Admitting: Family Medicine

## 2011-03-30 ENCOUNTER — Encounter: Payer: Self-pay | Admitting: Family Medicine

## 2011-03-30 DIAGNOSIS — Z125 Encounter for screening for malignant neoplasm of prostate: Secondary | ICD-10-CM

## 2011-03-30 DIAGNOSIS — Z0000 Encounter for general adult medical examination without abnormal findings: Secondary | ICD-10-CM | POA: Insufficient documentation

## 2011-03-30 DIAGNOSIS — E785 Hyperlipidemia, unspecified: Secondary | ICD-10-CM

## 2011-03-30 LAB — LIPID PANEL
Total CHOL/HDL Ratio: 7
Triglycerides: 237 mg/dL — ABNORMAL HIGH (ref 0.0–149.0)
VLDL: 47.4 mg/dL — ABNORMAL HIGH (ref 0.0–40.0)

## 2011-03-30 LAB — PSA: PSA: 1.55 ng/mL (ref 0.10–4.00)

## 2011-03-30 MED ORDER — TADALAFIL 5 MG PO TABS: ORAL_TABLET | ORAL | Status: DC

## 2011-03-30 NOTE — Progress Notes (Signed)
  Subjective:    Patient ID: Dillon Singh, male    DOB: 11-09-1960, 51 y.o.   MRN: [id number]  HPI Here today for CPE.  Risk Factors: Hyperlipidemia- chronic problem, on pravastatin. Denies abd pain, N/V.   Physical Activity: limited physical activity due to joint pain Fall Risk: low risk, steady on feet Depression: chronic problem Hearing: normal to whispered voice ADL's: independent Cognitive: normal linear thought process, memory and attention intact Home Safety: safe at home, lives w/ wife Height, Weight, BMI, Visual Acuity: see vitals, vision corrected to 20/20 w/ glasses Counseling: UTD on colonoscopy, DEXA Labs Ordered: See A&P Care Plan: See A&P    Review of Systems Patient reports no vision/hearing changes, anorexia, fever ,adenopathy, persistant/recurrent hoarseness, swallowing issues, chest pain, palpitations, edema, persistant/recurrent cough, hemoptysis, dyspnea (rest,exertional, paroxysmal nocturnal), gastrointestinal  bleeding (melena, rectal bleeding), abdominal pain, excessive heart burn, GU symptoms (dysuria, hematuria, voiding/incontinence issues) syncope, focal weakness, memory loss, numbness & tingling, skin/hair/nail changes, abnormal bruising/bleeding.     Objective:   Physical Exam BP 108/80  Pulse 86  Temp(Src) 98.9 F (37.2 C) (Oral)  Ht 5' 11.5" (1.816 m)  Wt 195 lb 6.4 oz (88.633 kg)  BMI 26.87 kg/m2  SpO2 98%  General Appearance:    Alert, cooperative, no distress, appears stated age  Head:    Normocephalic, without obvious abnormality, atraumatic  Eyes:    PERRL, conjunctiva/corneas clear, EOM's intact, fundi    benign, both eyes       Ears:    Normal TM's and external ear canals, both ears  Nose:   Nares normal, septum midline, mucosa normal, no drainage   or sinus tenderness  Throat:   Lips, mucosa, and tongue normal; teeth and gums normal  Neck:   Supple, symmetrical, trachea midline, no adenopathy;       thyroid:  No  enlargement/tenderness/nodules  Back:     Symmetric, no curvature, ROM normal, no CVA tenderness  Lungs:     Clear to auscultation bilaterally, respirations unlabored  Chest wall:    No tenderness or deformity  Heart:    Regular rate and rhythm, S1 and S2 normal, no murmur, rub   or gallop  Abdomen:     Soft, non-tender, bowel sounds active all four quadrants,    no masses, no organomegaly  Genitalia:    Normal male without lesion, discharge or tenderness  Rectal:    Normal tone, normal prostate, no masses or tenderness  Extremities:   Extremities normal, atraumatic, no cyanosis or edema  Pulses:   2+ and symmetric all extremities  Skin:   Skin color, texture, turgor normal, no rashes or lesions  Lymph nodes:   Cervical, supraclavicular, and axillary nodes normal  Neurologic:   CNII-XII intact. Normal strength, sensation and reflexes      throughout          Assessment & Plan:

## 2011-03-30 NOTE — Patient Instructions (Signed)
Follow up in 6 months to recheck cholesterol We'll notify you of your lab results Call with any questions or concerns You look great!  Keep up the good work! Happy Valentine's Day and early birthday!

## 2011-03-31 MED ORDER — FENOFIBRATE 160 MG PO TABS: 160.0000 mg | ORAL_TABLET | Freq: Every day | ORAL | Status: DC

## 2011-04-06 NOTE — Assessment & Plan Note (Signed)
Pt's PE WNL.  UTD on health maintenance.  Reviewed labs from last visit, will order remaining labs.  Anticipatory guidance provided.

## 2011-04-06 NOTE — Assessment & Plan Note (Signed)
Chronic problem.  Tolerating statin w/out difficulty.  Due for labs.  Adjust meds prn. 

## 2011-04-15 ENCOUNTER — Telehealth: Payer: Self-pay | Admitting: *Deleted

## 2011-04-15 MED ORDER — TADALAFIL 5 MG PO TABS: ORAL_TABLET | ORAL | Status: DC

## 2011-04-15 NOTE — Telephone Encounter (Signed)
Pt left vm stating he did not receive his medication rx for cialis, rx sent to pharmacy by e-script Pt wife noted as designated party was seen in office today, alerted that his rx was sent as requested.

## 2011-04-22 ENCOUNTER — Telehealth: Payer: Self-pay | Admitting: *Deleted

## 2011-04-22 NOTE — Telephone Encounter (Signed)
Received drug request form for pt, however no drug listed, called pt to clarify which drug, pt noted the drug needed is the Venlafaxine, pt notes that he is almost out of the medication and wants to know if it will be safe for him to take the cymbalta he still has left over til the venlafaxine comes in? I advised that I will send MD Beverely Low a message and contact pt back on Monday with instructions per cymbalta. Pt understood.

## 2011-04-24 NOTE — Telephone Encounter (Signed)
It would be safe to take the cymbalta until the effexor comes in- he just can't take both together

## 2011-04-25 NOTE — Telephone Encounter (Signed)
Pt advised that he understands the instructions.

## 2011-04-25 NOTE — Telephone Encounter (Signed)
Called pt to instruct per MD Beverely Low note safe to take the cymbalta until the effexor comes in- he just can't take both together

## 2011-04-26 ENCOUNTER — Telehealth: Payer: Self-pay

## 2011-04-26 NOTE — Telephone Encounter (Signed)
Re: patient's Venlafaxine prior authorization.  It has been approved effective 04/26/11 through 04/25/12. Any questions, you may contact [employer]

## 2011-06-21 ENCOUNTER — Other Ambulatory Visit: Payer: Self-pay | Admitting: Family Medicine

## 2011-06-21 MED ORDER — VENLAFAXINE HCL ER 150 MG PO TB24: 1.0000 | ORAL_TABLET | Freq: Every day | ORAL | Status: DC

## 2011-06-21 NOTE — Telephone Encounter (Signed)
refill for Venlafaxine HCL ER 150MG  Tab Qty 30 Take one tablet by mouth daily  Last filled 03.25.2013  Last OV 02.06.2013

## 2011-06-21 NOTE — Telephone Encounter (Signed)
Rx sent 

## 2011-06-30 ENCOUNTER — Encounter: Payer: Self-pay | Admitting: Family Medicine

## 2011-06-30 ENCOUNTER — Ambulatory Visit (INDEPENDENT_AMBULATORY_CARE_PROVIDER_SITE_OTHER): Payer: Medicare Other | Admitting: Family Medicine

## 2011-06-30 VITALS — BP 124/78 | HR 102 | Temp 98.3°F | Ht 69.75 in | Wt 197.6 lb

## 2011-06-30 DIAGNOSIS — M2550 Pain in unspecified joint: Secondary | ICD-10-CM

## 2011-06-30 MED ORDER — OXYCODONE HCL 30 MG PO TABS: 30.0000 mg | ORAL_TABLET | ORAL | Status: AC | PRN

## 2011-06-30 NOTE — Assessment & Plan Note (Signed)
Pt was d/c'd from pain management due to use of someone else's meds when caught w/out his while visiting family.  Was not given chance to explain situation.  Will give pt script and refer him to new pain management.  Stressed the need to follow contract to the letter to avoid further misunderstandings.

## 2011-06-30 NOTE — Patient Instructions (Signed)
We'll call you with your pain management appt Continue the Oxycodone as directed Don't take any additional pain meds Hang in there!!!

## 2011-06-30 NOTE — Progress Notes (Signed)
  Subjective:    Patient ID: Dillon Singh, male    DOB: 14-Dec-1960, 51 y.o.   MRN: [id number]  HPI Chronic pain- sxs started 5+ yrs ago, has been seeing Pain Management PA for 3 yrs.  Went for appt Monday and was given pt dismissal letter due to abnormal UDS.  Was told that he had traces of hydrocodone in urine.  Pt is very upset about this.  Is on Oxy 30mg  6x/day, Soma, Neurontin.  Reports that he is not able to fxn on lower doses of pain meds as suggested in the pain management letter.  'i'm so stressed out over this I don't know what to do'.  'i've had 5 different doctors give me 5 different dx'.  Admits that he did take Hydrocodone b/c he was visiting his daughter and did not have meds- took son-in-laws meds.  Knows that he made mistake.   Review of Systems For ROS see HPI     Objective:   Physical Exam  Vitals reviewed. Constitutional: He appears well-developed and well-nourished. No distress.  Psychiatric: He has a normal mood and affect. His behavior is normal. Judgment and thought content normal.          Assessment & Plan:

## 2011-07-14 ENCOUNTER — Telehealth: Payer: Self-pay | Admitting: Family Medicine

## 2011-07-14 MED ORDER — VENLAFAXINE HCL ER 150 MG PO TB24: 1.0000 | ORAL_TABLET | Freq: Every day | ORAL | Status: DC

## 2011-07-14 NOTE — Telephone Encounter (Signed)
rx sent to pharmacy by e-script  

## 2011-07-14 NOTE — Telephone Encounter (Signed)
Refill: Venlafaxine hcl er 150mg  tab. Take 1 tablet by mouth daily. Qty 30. Last fill 3.25.13

## 2011-07-15 ENCOUNTER — Telehealth: Payer: Self-pay | Admitting: Family Medicine

## 2011-07-15 MED ORDER — OXYCODONE HCL 30 MG PO TABS: 30.0000 mg | ORAL_TABLET | ORAL | Status: DC | PRN

## 2011-07-15 NOTE — Telephone Encounter (Signed)
Pt left vm stating that he has medication that needs refill to last til his pain management apt, spoke to MD Drue Novel who reviewed pt chart and gave verbal order to write pt a 4 day supply for this medication for Oxycodone, printed rx and waited for pt to pick up per office closes in 5 minutes and pt stated he lives one mile [place] the road,pt signed form stating he pick up RX for #24 with no refills

## 2011-07-15 NOTE — Telephone Encounter (Signed)
Per Myer Peer, RN at 1249pm   Caller: Dillon Singh/Patient; PCP: Sheliah Hatch.; CB#: [phone]; ; ; Call regarding Patient Is Between Pain Management Doctors and Dr. Beverely Low Had Given Him Some To DO Him Until He Sees the New Pain Management Doctor, But He Ran Out Before the Appointment;  Please call. Pt states he will run out of the Oxycodone on Monday and appt is on Thursday for pain management. He wants to know if he can get pills to last those few days. He is aware this rx must be written and can not be called in. He is hoping to get this today.

## 2011-07-19 NOTE — Telephone Encounter (Signed)
Appreciate Dr Leta Jungling assistance.  Pt needs to take meds exactly as prescribed or he will again have trouble w/ pain management.

## 2011-07-19 NOTE — Telephone Encounter (Signed)
msg left for a return call.      KP 

## 2011-08-24 ENCOUNTER — Telehealth: Payer: Self-pay | Admitting: Family Medicine

## 2011-08-24 NOTE — Telephone Encounter (Signed)
Please review per MD Beverely Low notes that the pt has been referred to all available pain management programs

## 2011-08-24 NOTE — Telephone Encounter (Signed)
Pt would like another referral to pain management. He states the first one "did not pan out."

## 2011-08-31 NOTE — Telephone Encounter (Signed)
I have referred patient to [facility] in hopes that they will accept patient.  Pain management appointments are more difficult and sometimes unlikely for patients to get, when they already have history with 1 or more other pain clinics.

## 2011-09-02 NOTE — Telephone Encounter (Signed)
Per fax from [facility], they have accepted patient.  His appointment is 09/14/11, and the patient is aware.

## 2011-09-02 NOTE — Telephone Encounter (Signed)
Thank you :)

## 2011-10-27 ENCOUNTER — Telehealth: Payer: Self-pay

## 2011-10-27 NOTE — Telephone Encounter (Signed)
Caller from [facility] would like to have a Medical Necessity for Oxycodone that was rx'ed by Dr.Paz on 07/17/11.  I briefly discussed this over with Dr.Tabori and she indicated patient with several DX of pain, Oxycodone was rx'ed as a bridge gap until patient could see Pain Management.   I called [facility] and gave the DX of Fibromyalgia, Chronic Back Pain and Pain in Joints (Multiple Sites)

## 2011-11-17 ENCOUNTER — Ambulatory Visit (INDEPENDENT_AMBULATORY_CARE_PROVIDER_SITE_OTHER): Payer: Medicare Other | Admitting: Family Medicine

## 2011-11-17 ENCOUNTER — Encounter: Payer: Self-pay | Admitting: Family Medicine

## 2011-11-17 VITALS — BP 121/76 | HR 72 | Temp 98.0°F | Ht 72.0 in | Wt 195.6 lb

## 2011-11-17 DIAGNOSIS — E236 Other disorders of pituitary gland: Secondary | ICD-10-CM

## 2011-11-17 DIAGNOSIS — F419 Anxiety disorder, unspecified: Secondary | ICD-10-CM | POA: Insufficient documentation

## 2011-11-17 DIAGNOSIS — M549 Dorsalgia, unspecified: Secondary | ICD-10-CM

## 2011-11-17 DIAGNOSIS — F411 Generalized anxiety disorder: Secondary | ICD-10-CM

## 2011-11-17 MED ORDER — LORAZEPAM 0.5 MG PO TABS: ORAL_TABLET | ORAL | Status: DC

## 2011-11-17 NOTE — Progress Notes (Signed)
  Subjective:    Patient ID: Dillon Singh, male    DOB: 1960-02-24, 51 y.o.   MRN: [id number]  HPI Pituitary disorder- is seeing Dr Talmage Nap but is having difficulty getting appts.  Wants new endo or for me to manage thyroid and testosterone.  Periodontal disease- 'severe bone loss', is having all upper teeth pulled.  Is very anxious about upcoming procedure.  Would like anxiety med for day of procedure.  Pain management- pt reports he spent 2 yrs prior to getting disability going 'from doctor to doctor.  Well over 20 doctors'.  Pt reports pain clinic doesn't want to give him his pain med regimen w/out current MRI 'showing damage'.  '4 pills is max they will give any pt and they won't prescribe soma'.  Pt is currently at [facility] [place] and prior to this [facility].   Review of Systems For ROS see HPI     Objective:   Physical Exam  Vitals reviewed. Constitutional: He is oriented to person, place, and time. He appears well-developed and well-nourished. No distress.  HENT:  Head: Normocephalic and atraumatic.  Neurological: He is alert and oriented to person, place, and time. No cranial nerve deficit. Coordination normal.  Skin: Skin is warm and dry.  Psychiatric: He has a normal mood and affect. His behavior is normal.          Assessment & Plan:

## 2011-11-17 NOTE — Patient Instructions (Addendum)
We'll try and get you in w/ another pain clinic We'll call you with your endocrinology appt Take the Ativan prior to your dental procedure Call with any questions or concerns Hang in there!!!

## 2011-11-21 NOTE — Assessment & Plan Note (Signed)
New.  sxs center around upcoming dental/peridontal procedure.  Will give benzo to premedicate prior to procedure w/ understanding that he is not to drive on meds.  Pt only given 2 pills given situation w/ narcotics, pain management, and hx of medication misuse in past.

## 2011-11-21 NOTE — Assessment & Plan Note (Signed)
Chronic problem.  Due to empty sella told him i don't feel comfortable managing his endo issues.  Will refer to new provider and assist as able.

## 2011-11-21 NOTE — Assessment & Plan Note (Signed)
Chronic problem.  Pt was dismissed from long term pain management due to taking someone else's meds.  Pt has since see 2 other pain clinics but has not been happy w/ either b/c both have tried to change regimen and wean down from current high level of meds.  Pt wants to me write scripts.  Refused.  Told him that i do not treat chronic pain and he will have to abide by whatever pain management decides.  Would like to try another pain management.  Had very frank conversation that his previous bad decisions have put him in a bad spot- pain clinics have a zero tolerance policy and now that he has been dismissed, others are leery to take him on as a pt b/c he's a liability.  Doesn't doesn't understand why he's 'being punished for 1 mistake- i'm only human'.  We discussed that mistakes have consequences, and his inability to get the meds he wants may be his.  Pt clear on my firm stance.  Will re-refer to pain management and follow.

## 2011-11-29 ENCOUNTER — Telehealth: Payer: Self-pay | Admitting: Family Medicine

## 2011-11-29 NOTE — Telephone Encounter (Signed)
Thank you noted.

## 2011-11-29 NOTE — Telephone Encounter (Signed)
In reference to patient's recent request to be referred to [facility], he just called me & said to cancel referral.  Patient states he is sorry he didn't let me know sooner, but that he has "come to terms" with [facility], and has decided to remain their patient.

## 2011-12-20 ENCOUNTER — Other Ambulatory Visit: Payer: Self-pay

## 2011-12-20 MED ORDER — VENLAFAXINE HCL ER 150 MG PO TB24: 1.0000 | ORAL_TABLET | Freq: Every day | ORAL | Status: DC

## 2011-12-20 NOTE — Telephone Encounter (Signed)
Pt verified pharmacy and Rx sent.    MW

## 2012-05-07 ENCOUNTER — Other Ambulatory Visit: Payer: Self-pay | Admitting: Family Medicine

## 2012-05-07 NOTE — Telephone Encounter (Signed)
Ok for #30, needs to schedule appt/CPE overdue

## 2012-05-07 NOTE — Telephone Encounter (Signed)
Please advise on RF request.  Last CPE:03-30-11,last RF:03-30-12.//AB/CMA

## 2012-05-24 ENCOUNTER — Encounter: Payer: Self-pay | Admitting: Lab

## 2012-05-25 ENCOUNTER — Encounter: Payer: Self-pay | Admitting: Family Medicine

## 2012-05-25 ENCOUNTER — Ambulatory Visit (INDEPENDENT_AMBULATORY_CARE_PROVIDER_SITE_OTHER): Payer: Medicare Other | Admitting: Family Medicine

## 2012-05-25 VITALS — BP 120/80 | HR 87 | Temp 98.4°F | Ht >= 80 in | Wt 198.4 lb

## 2012-05-25 DIAGNOSIS — F419 Anxiety disorder, unspecified: Secondary | ICD-10-CM

## 2012-05-25 DIAGNOSIS — E039 Hypothyroidism, unspecified: Secondary | ICD-10-CM

## 2012-05-25 DIAGNOSIS — M549 Dorsalgia, unspecified: Secondary | ICD-10-CM

## 2012-05-25 DIAGNOSIS — E785 Hyperlipidemia, unspecified: Secondary | ICD-10-CM

## 2012-05-25 DIAGNOSIS — F411 Generalized anxiety disorder: Secondary | ICD-10-CM

## 2012-05-25 LAB — BASIC METABOLIC PANEL
BUN: 14 mg/dL (ref 6–23)
Calcium: 9 mg/dL (ref 8.4–10.5)
GFR: 77.38 mL/min (ref >60.00)
Glucose, Bld: 92 mg/dL (ref 70–99)
Potassium: 4 mEq/L (ref 3.5–5.1)
Sodium: 133 mEq/L — ABNORMAL LOW (ref 135–145)

## 2012-05-25 LAB — HEPATIC FUNCTION PANEL
AST: 21 U/L (ref 0–37)
Albumin: 3.9 g/dL (ref 3.5–5.2)
Alkaline Phosphatase: 57 U/L (ref 39–117)
Total Bilirubin: 0.4 mg/dL (ref 0.3–1.2)

## 2012-05-25 LAB — LIPID PANEL
Cholesterol: 221 mg/dL — ABNORMAL HIGH (ref 0–200)
HDL: 23.7 mg/dL — ABNORMAL LOW (ref >39.00)
Total CHOL/HDL Ratio: 9
VLDL: 24.6 mg/dL (ref 0.0–40.0)

## 2012-05-25 MED ORDER — VENLAFAXINE HCL ER 150 MG PO CP24: ORAL_CAPSULE | ORAL | Status: DC

## 2012-05-25 NOTE — Assessment & Plan Note (Signed)
Chronic problem, pt reports this is currently well controlled on Venlafaxine.  No changes.  Refills provided.

## 2012-05-25 NOTE — Patient Instructions (Addendum)
Follow up in 6 months to recheck cholesterol We'll notify you of your lab results and make any changes if needed Keep up the good work!  You look great! Call with any questions or concerns Happy Belated Birthday!

## 2012-05-25 NOTE — Assessment & Plan Note (Signed)
Chronic problem.  Pt stopped all meds.  Check labs.  Restart meds prn.

## 2012-05-25 NOTE — Assessment & Plan Note (Signed)
Chronic problem, following w/ pain management.  Pt has successfully weaned pain meds down to a much more reasonable dose.  Applauded pt's efforts.  Will follow along.

## 2012-05-25 NOTE — Assessment & Plan Note (Signed)
Chronic problem, following w/ Dr Talmage Nap.  Check labs.  Will fax to provider.

## 2012-05-25 NOTE — Progress Notes (Signed)
  Subjective:    Patient ID: Dillon Singh, male    DOB: 1961-01-31, 52 y.o.   MRN: [id number]  HPI Hyperlipidemia- chronic problem, stopped Fenofibrate and pravachol ~1 yr ago.  Due for labs.  Limited exercise due to pain.  Denies CP, SOB, HAs, visual changes.  Hypothyroid- chronic problem, following w/ Dr Talmage Nap.  Last labs 8-9 months ago.  Now on 112 mcg daily.  Hx of chronic fatigue.  Denies heat/cold intolerance.  Depression- chronic problem, on Venlafaxine.  Feels sxs are well controlled.  Sleeping well.  Eating regularly.  Chronic pain- has decreased from 6 30mg  tabs daily to 3 15mg  tabs of Oxycodone daily.  Following w/ pain management.   Review of Systems For ROS see HPI     Objective:   Physical Exam  Vitals reviewed. Constitutional: He is oriented to person, place, and time. He appears well-developed and well-nourished. No distress.  HENT:  Head: Normocephalic and atraumatic.  Eyes: Conjunctivae and EOM are normal. Pupils are equal, round, and reactive to light.  Neck: Normal range of motion. Neck supple. No thyromegaly present.  Cardiovascular: Normal rate, regular rhythm, normal heart sounds and intact distal pulses.   No murmur heard. Pulmonary/Chest: Effort normal and breath sounds normal. No respiratory distress.  Abdominal: Soft. Bowel sounds are normal. He exhibits no distension.  Musculoskeletal: He exhibits no edema.  Lymphadenopathy:    He has no cervical adenopathy.  Neurological: He is alert and oriented to person, place, and time. No cranial nerve deficit.  Skin: Skin is warm and dry.  Psychiatric: He has a normal mood and affect. His behavior is normal.          Assessment & Plan:

## 2012-05-29 ENCOUNTER — Telehealth: Payer: Self-pay | Admitting: *Deleted

## 2012-05-29 MED ORDER — ATORVASTATIN CALCIUM 20 MG PO TABS: 20.0000 mg | ORAL_TABLET | Freq: Every day | ORAL | Status: DC

## 2012-05-29 NOTE — Telephone Encounter (Signed)
Spoke with the pt and informed him of his recent lab results and note.  Pt understood and agreed to start new meds.  New rx sent to the pharmacy by e-script, and recent labs faxed to Dr. Balan.//AB/CMA

## 2012-05-29 NOTE — Telephone Encounter (Signed)
Message copied by Verdie Shire on Tue May 29, 2012  5:50 PM ------      Message from: Sheliah Hatch      Created: Fri May 25, 2012  4:41 PM       Total cholesterol and LDL are both very high- putting him at risk for heart attack and stroke.  Based on this, needs to start Lipitor 20mg  nightly.      TSH is also slightly low- please fax results to Dr Talmage Nap (endo) ------

## 2012-06-04 ENCOUNTER — Other Ambulatory Visit: Payer: Self-pay | Admitting: Family Medicine

## 2012-06-21 ENCOUNTER — Encounter: Payer: Self-pay | Admitting: Family Medicine

## 2012-12-20 ENCOUNTER — Encounter: Payer: Self-pay | Admitting: Family Medicine

## 2012-12-20 ENCOUNTER — Ambulatory Visit (INDEPENDENT_AMBULATORY_CARE_PROVIDER_SITE_OTHER): Payer: Medicare Other | Admitting: Family Medicine

## 2012-12-20 VITALS — BP 130/90 | HR 69 | Temp 98.2°F | Resp 16 | Wt 195.0 lb

## 2012-12-20 DIAGNOSIS — M2550 Pain in unspecified joint: Secondary | ICD-10-CM

## 2012-12-20 DIAGNOSIS — E785 Hyperlipidemia, unspecified: Secondary | ICD-10-CM

## 2012-12-20 DIAGNOSIS — Z23 Encounter for immunization: Secondary | ICD-10-CM

## 2012-12-20 LAB — LIPID PANEL
Cholesterol: 147 mg/dL (ref 0–200)
HDL: 30.7 mg/dL — ABNORMAL LOW (ref >39.00)
Total CHOL/HDL Ratio: 5
Triglycerides: 118 mg/dL (ref 0.0–149.0)

## 2012-12-20 LAB — HEPATIC FUNCTION PANEL
AST: 18 U/L (ref 0–37)
Albumin: 4 g/dL (ref 3.5–5.2)
Bilirubin, Direct: 0 mg/dL (ref 0.0–0.3)
Total Bilirubin: 0.5 mg/dL (ref 0.3–1.2)
Total Protein: 6.9 g/dL (ref 6.0–8.3)

## 2012-12-20 MED ORDER — CARISOPRODOL 350 MG PO TABS: 350.0000 mg | ORAL_TABLET | Freq: Three times a day (TID) | ORAL | Status: DC

## 2012-12-20 NOTE — Progress Notes (Signed)
  Subjective:    Patient ID: Dillon Singh, male    DOB: 01-01-1961, 52 y.o.   MRN: [id number]  HPI Pain- chronic neck and back pain.  Has been off Soma for 'a couple of years'.  Went off all pain meds this spring (weaned off 180mg  oxycodone daily).  Pt decided to do this based on side effects.  Would like to restart Soma.  Hyperlipidemia- chronic problem, on Lipitor daily.  Has been walking daily.  Denies abd pain, N/V, increased myalgias.  Taking meds 5-6x/week.   Review of Systems For ROS see HPI     Objective:   Physical Exam  Vitals reviewed. Constitutional: He is oriented to person, place, and time. He appears well-developed and well-nourished. No distress.  HENT:  Head: Normocephalic and atraumatic.  Eyes: Conjunctivae and EOM are normal. Pupils are equal, round, and reactive to light.  Neck: Normal range of motion. Neck supple. No thyromegaly present.  Cardiovascular: Normal rate, regular rhythm, normal heart sounds and intact distal pulses.   No murmur heard. Pulmonary/Chest: Effort normal and breath sounds normal. No respiratory distress.  Abdominal: Soft. Bowel sounds are normal. He exhibits no distension.  Musculoskeletal: He exhibits no edema.  Lymphadenopathy:    He has no cervical adenopathy.  Neurological: He is alert and oriented to person, place, and time. No cranial nerve deficit.  Skin: Skin is warm and dry.  Psychiatric: He has a normal mood and affect. His behavior is normal.          Assessment & Plan:

## 2012-12-20 NOTE — Assessment & Plan Note (Signed)
Chronic problem.  Tolerating statin w/out difficulty.  Check labs.  Adjust meds prn  

## 2012-12-20 NOTE — Patient Instructions (Signed)
Schedule your complete physical in 6 months We'll notify you of your lab results and make any changes if needed Keep up the good work!  You look great!!! Call with any questions or concerns Happy Holidays!! 

## 2012-12-20 NOTE — Assessment & Plan Note (Signed)
Applauded pt's decision to wean off narcotic medications.  Will restart the [facility] for pain relief and muscle spasm.  Will continue to follow.

## 2012-12-21 ENCOUNTER — Encounter: Payer: Self-pay | Admitting: General Practice

## 2013-02-20 ENCOUNTER — Other Ambulatory Visit: Payer: Self-pay | Admitting: Family Medicine

## 2013-02-20 NOTE — Telephone Encounter (Signed)
Med filled.  

## 2013-03-21 ENCOUNTER — Telehealth: Payer: Self-pay

## 2013-03-21 ENCOUNTER — Emergency Department (HOSPITAL_BASED_OUTPATIENT_CLINIC_OR_DEPARTMENT_OTHER)
Admission: EM | Admit: 2013-03-21 | Discharge: 2013-03-21 | Disposition: A | Discharge status: Home / Self Care | Payer: Medicare HMO | Attending: Emergency Medicine | Admitting: Emergency Medicine

## 2013-03-21 ENCOUNTER — Emergency Department (HOSPITAL_BASED_OUTPATIENT_CLINIC_OR_DEPARTMENT_OTHER): Payer: Medicare HMO

## 2013-03-21 ENCOUNTER — Encounter (HOSPITAL_BASED_OUTPATIENT_CLINIC_OR_DEPARTMENT_OTHER): Payer: Self-pay | Admitting: Emergency Medicine

## 2013-03-21 DIAGNOSIS — B349 Viral infection, unspecified: Secondary | ICD-10-CM

## 2013-03-21 DIAGNOSIS — H9319 Tinnitus, unspecified ear: Secondary | ICD-10-CM | POA: Insufficient documentation

## 2013-03-21 DIAGNOSIS — Z87448 Personal history of other diseases of urinary system: Secondary | ICD-10-CM | POA: Insufficient documentation

## 2013-03-21 DIAGNOSIS — E785 Hyperlipidemia, unspecified: Secondary | ICD-10-CM | POA: Insufficient documentation

## 2013-03-21 DIAGNOSIS — B9789 Other viral agents as the cause of diseases classified elsewhere: Secondary | ICD-10-CM | POA: Insufficient documentation

## 2013-03-21 DIAGNOSIS — Z79899 Other long term (current) drug therapy: Secondary | ICD-10-CM | POA: Insufficient documentation

## 2013-03-21 DIAGNOSIS — G4700 Insomnia, unspecified: Secondary | ICD-10-CM | POA: Insufficient documentation

## 2013-03-21 DIAGNOSIS — R0602 Shortness of breath: Secondary | ICD-10-CM | POA: Insufficient documentation

## 2013-03-21 DIAGNOSIS — R61 Generalized hyperhidrosis: Secondary | ICD-10-CM | POA: Insufficient documentation

## 2013-03-21 DIAGNOSIS — F17200 Nicotine dependence, unspecified, uncomplicated: Secondary | ICD-10-CM | POA: Insufficient documentation

## 2013-03-21 DIAGNOSIS — R51 Headache: Secondary | ICD-10-CM | POA: Insufficient documentation

## 2013-03-21 DIAGNOSIS — R110 Nausea: Secondary | ICD-10-CM | POA: Insufficient documentation

## 2013-03-21 DIAGNOSIS — E039 Hypothyroidism, unspecified: Secondary | ICD-10-CM | POA: Insufficient documentation

## 2013-03-21 LAB — COMPREHENSIVE METABOLIC PANEL
ALT: 17 U/L (ref 0–53)
AST: 18 U/L (ref 0–37)
Albumin: 4.2 g/dL (ref 3.5–5.2)
Alkaline Phosphatase: 71 U/L (ref 39–117)
BILIRUBIN TOTAL: 0.3 mg/dL (ref 0.3–1.2)
BUN: 16 mg/dL (ref 6–23)
CHLORIDE: 100 meq/L (ref 96–112)
CO2: 24 meq/L (ref 19–32)
Calcium: 9.2 mg/dL (ref 8.4–10.5)
Creatinine, Ser: 1.2 mg/dL (ref 0.50–1.35)
GFR calc Af Amer: 78 mL/min — ABNORMAL LOW (ref >90)
GFR, EST NON AFRICAN AMERICAN: 67 mL/min — AB (ref >90)
Glucose, Bld: 85 mg/dL (ref 70–99)
Potassium: 4.3 mEq/L (ref 3.7–5.3)
Sodium: 138 mEq/L (ref 137–147)
Total Protein: 7.6 g/dL (ref 6.0–8.3)

## 2013-03-21 LAB — CBC WITH DIFFERENTIAL/PLATELET
BASOS PCT: 1 % (ref 0–1)
Basophils Absolute: 0.1 10*3/uL (ref 0.0–0.1)
Eosinophils Absolute: 0.2 10*3/uL (ref 0.0–0.7)
Eosinophils Relative: 1 % (ref 0–5)
HEMATOCRIT: 44.1 % (ref 39.0–52.0)
HEMOGLOBIN: 15.5 g/dL (ref 13.0–17.0)
LYMPHS PCT: 31 % (ref 12–46)
Lymphs Abs: 4 10*3/uL (ref 0.7–4.0)
MCH: 29.5 pg (ref 26.0–34.0)
MCHC: 35.1 g/dL (ref 30.0–36.0)
MCV: 84 fL (ref 78.0–100.0)
MONO ABS: 0.9 10*3/uL (ref 0.1–1.0)
Monocytes Relative: 7 % (ref 3–12)
NEUTROS ABS: 7.8 10*3/uL — AB (ref 1.7–7.7)
Neutrophils Relative %: 60 % (ref 43–77)
Platelets: 247 10*3/uL (ref 150–400)
RBC: 5.25 MIL/uL (ref 4.22–5.81)
RDW: 13.1 % (ref 11.5–15.5)
WBC: 13 10*3/uL — ABNORMAL HIGH (ref 4.0–10.5)

## 2013-03-21 LAB — URINALYSIS W MICROSCOPIC + REFLEX CULTURE
Bilirubin Urine: NEGATIVE
Glucose, UA: NEGATIVE mg/dL
Hgb urine dipstick: NEGATIVE
Ketones, ur: NEGATIVE mg/dL
Nitrite: NEGATIVE
PROTEIN: NEGATIVE mg/dL
Specific Gravity, Urine: 1.006 (ref 1.005–1.030)
UROBILINOGEN UA: 0.2 mg/dL (ref 0.0–1.0)
pH: 6 (ref 5.0–8.0)

## 2013-03-21 MED ORDER — SODIUM CHLORIDE 0.9 % IV BOLUS (SEPSIS)
1000.0000 mL | INTRAVENOUS | Status: AC
  Administered 2013-03-21: 1000 mL via INTRAVENOUS

## 2013-03-21 MED ORDER — ACETAMINOPHEN 325 MG PO TABS
650.0000 mg | ORAL_TABLET | Freq: Once | ORAL | Status: AC
  Administered 2013-03-21: 650 mg via ORAL
  Filled 2013-03-21: qty 2

## 2013-03-21 MED ORDER — DEXAMETHASONE SODIUM PHOSPHATE 10 MG/ML IJ SOLN
10.0000 mg | Freq: Once | INTRAMUSCULAR | Status: AC
  Administered 2013-03-21: 10 mg via INTRAVENOUS
  Filled 2013-03-21: qty 1

## 2013-03-21 MED ORDER — DIPHENHYDRAMINE HCL 50 MG/ML IJ SOLN
25.0000 mg | Freq: Once | INTRAMUSCULAR | Status: AC
  Administered 2013-03-21: 25 mg via INTRAVENOUS
  Filled 2013-03-21: qty 1

## 2013-03-21 MED ORDER — METOCLOPRAMIDE HCL 5 MG/ML IJ SOLN
5.0000 mg | Freq: Once | INTRAMUSCULAR | Status: AC
  Administered 2013-03-21: 5 mg via INTRAVENOUS
  Filled 2013-03-21: qty 2

## 2013-03-21 NOTE — ED Notes (Signed)
Dizziness x 5 days. Night sweats 4-5 times a night. Ringing in his ears and nausea.

## 2013-03-21 NOTE — Telephone Encounter (Signed)
Spouse presents to the lobby due to patient need for referral to Dr Chalmers Cater. Patient has new insurance that requires referral. Other concern is that patient has been very ill. GI issues along with weakness,fatigue and inability to get himself up. He had labs drawn at Endo this morning but they could not process until the insurance issues was handled. Spouse requested an appt to see PCP. Consulted with PCP who agrees with plan to refer patient to the ED. Advise given to spouse who agrees to take him.

## 2013-03-21 NOTE — ED Notes (Signed)
Patient transported to X-ray 

## 2013-03-21 NOTE — ED Provider Notes (Signed)
CSN: [id number]     Arrival date & time 03/21/13  1434 History   First MD Initiated Contact with Patient 03/21/13 1608     Chief Complaint  Patient presents with  . Dizziness   (Consider location/radiation/quality/duration/timing/severity/associated sxs/prior Treatment) Patient is a 53 y.o. male presenting with dizziness. The history is provided by the patient.  Dizziness Quality:  Lightheadedness Severity:  Mild Onset quality:  Sudden Duration:  5 days Timing:  Intermittent Progression:  Unchanged Chronicity:  New Context comment:  Seems unprovoked Relieved by:  Nothing Worsened by:  Nothing tried Ineffective treatments:  None tried Associated symptoms: headaches, nausea, shortness of breath (mild) and tinnitus   Associated symptoms: no chest pain, no diarrhea and no vomiting     Past Medical History  Diagnosis Date  . Hypothyroidism   . Back pain   . Hip pain   . Hypopituitarism   . Erectile dysfunction   . Hyperlipidemia    Past Surgical History  Procedure Laterality Date  . Appendectomy    . 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 Diabetes Mother    History  Substance Use Topics  . Smoking status: Current Every Day Smoker -- 1.00 packs/day  . Smokeless tobacco: Not on file  . Alcohol Use: No    Review of Systems  Constitutional: Negative for fever.       Night sweats, insomnia  HENT: Positive for tinnitus. Negative for drooling and rhinorrhea.   Eyes: Negative for pain.  Respiratory: Positive for cough (mild) and shortness of breath (mild).   Cardiovascular: Negative for chest pain and leg swelling.  Gastrointestinal: Positive for nausea. Negative for vomiting, abdominal pain and diarrhea.  Genitourinary: Negative for dysuria and hematuria.  Musculoskeletal: Negative for gait problem and neck pain.  Skin: Negative for color change.  Neurological: Positive for dizziness, light-headedness and  headaches. Negative for numbness.  Hematological: Negative for adenopathy.  Psychiatric/Behavioral: Negative for behavioral problems.  All other systems reviewed and are negative.    Allergies  Review of patient's allergies indicates no known allergies.  Home Medications   Current Outpatient Rx  Name  Route  Sig  Dispense  Refill  . atorvastatin (LIPITOR) 20 MG tablet   Oral   Take 1 tablet (20 mg total) by mouth at bedtime.   90 tablet   3   . carisoprodol (SOMA) 350 MG tablet   Oral   Take 1 tablet (350 mg total) by mouth 3 (three) times daily.   90 tablet   3   . EXPIRED: fenofibrate 160 MG tablet   Oral   Take 1 tablet (160 mg total) by mouth daily.   30 tablet   3   . gabapentin (NEURONTIN) 100 MG capsule   Oral   Take 100 mg by mouth at bedtime. PT ADVISED TO TAKE 2 TABLETS AT BEDTIME         . halobetasol (ULTRAVATE) 0.05 % cream      daily.           Marland Kitchen ibuprofen (ADVIL,MOTRIN) 200 MG tablet   Oral   Take 200 mg by mouth as needed.           Marland Kitchen levothyroxine (SYNTHROID, LEVOTHROID) 112 MCG tablet   Oral   Take 112 mcg by mouth daily before breakfast.         . LORazepam (ATIVAN) 0.5 MG tablet  1-2 tabs prior to dental procedure   2 tablet   0   . methocarbamol (ROBAXIN) 500 MG tablet   Oral   Take 2 tablets by mouth daily.         . pravastatin (PRAVACHOL) 40 MG tablet   Oral   Take 40 mg by mouth at bedtime.           . tadalafil (CIALIS) 5 MG tablet      1 tab daily   30 tablet   3   . Testosterone 1.25 GM/ACT (1%) GEL   Transdermal   Place onto the skin 2 (two) times daily.           Marland Kitchen venlafaxine XR (EFFEXOR-XR) 150 MG 24 hr capsule      TAKE ONE CAPSULE BY MOUTH EVERY DAY   30 capsule   5    BP 149/98  Pulse 86  Temp(Src) 98.4 F (36.9 C) (Oral)  Resp 18  Ht 5\' 11"  (1.803 m)  Wt 195 lb (88.451 kg)  BMI 27.21 kg/m2  SpO2 100% Physical Exam  Nursing note and vitals reviewed. Constitutional: He is  oriented to person, place, and time. He appears well-developed and well-nourished.  HENT:  Head: Normocephalic and atraumatic.  Right Ear: External ear normal.  Left Ear: External ear normal.  Nose: Nose normal.  Mouth/Throat: Oropharynx is clear and moist. No oropharyngeal exudate.  Eyes: Conjunctivae and EOM are normal. Pupils are equal, round, and reactive to light.  Neck: Normal range of motion. Neck supple.  Cardiovascular: Normal rate, regular rhythm, normal heart sounds and intact distal pulses.  Exam reveals no gallop and no friction rub.   No murmur heard. Pulmonary/Chest: Effort normal and breath sounds normal. No respiratory distress. He has no wheezes.  Abdominal: Soft. Bowel sounds are normal. He exhibits no distension. There is no tenderness. There is no rebound and no guarding.  Musculoskeletal: Normal range of motion. He exhibits no edema and no tenderness.  Neurological: He is alert and oriented to person, place, and time. He has normal strength. No cranial nerve deficit or sensory deficit. He displays a negative Romberg sign. Coordination and gait normal.  Normal finger to nose and heel to shin bilaterally.  Normal speech and understanding.  The patient is ambulatory forwards and backwards without difficulty. Normal tandem gait as well.  Skin: Skin is warm and dry.  Psychiatric: He has a normal mood and affect. His behavior is normal.    ED Course  Procedures (including critical care time) Labs Review Labs Reviewed  CBC WITH DIFFERENTIAL - Abnormal; Notable for the following:    WBC 13.0 (*)    Neutro Abs 7.8 (*)    All other components within normal limits  COMPREHENSIVE METABOLIC PANEL - Abnormal; Notable for the following:    GFR calc non Af Amer 67 (*)    GFR calc Af Amer 78 (*)    All other components within normal limits  URINALYSIS W MICROSCOPIC + REFLEX CULTURE - Abnormal; Notable for the following:    Leukocytes, UA TRACE (*)    All other components  within normal limits   Imaging Review Dg Chest 2 View  03/21/2013   CLINICAL DATA:  Shortness of breath, cough, smoking history  EXAM: CHEST  2 VIEW  COMPARISON:  None.  FINDINGS: The heart size and mediastinal contours are within normal limits. Both lungs are clear. The visualized skeletal structures are unremarkable. Mild pectus excavatum deformity.  IMPRESSION: No active cardiopulmonary  disease.   Electronically Signed   By: Skipper Cliche M.D.   On: 03/21/2013 17:06   Ct Head Wo Contrast  03/21/2013   CLINICAL DATA:  Headache, dizziness, bilateral tinnitus  EXAM: CT HEAD WITHOUT CONTRAST  TECHNIQUE: Contiguous axial images were obtained from the base of the skull through the vertex without intravenous contrast.  COMPARISON:  None.  FINDINGS: No mass lesion. No midline shift. No acute hemorrhage or hematoma. No extra-axial fluid collections. No evidence of acute infarction. Incidental note of cavum septum pellucidum and cavum vergae. Calvarium intact with no significant inflammatory change in the visualized sinuses or mastoid air cells.  IMPRESSION: No acute abnormalities   Electronically Signed   By: Skipper Cliche M.D.   On: 03/21/2013 17:04    EKG Interpretation    Date/Time:  Thursday March 21 2013 16:26:29 EST Ventricular Rate:  74 PR Interval:  160 QRS Duration: 92 QT Interval:  374 QTC Calculation: 415 R Axis:   68 Text Interpretation:  Normal sinus rhythm Incomplete right bundle branch block Borderline ECG Confirmed by Myana Schlup  MD, Aldeen Riga ([id number]) on 03/21/2013 5:54:47 PM            MDM   1. Viral syndrome    4:27 PM 53 y.o. male who presents with multiple symptoms which began approximately 5 days ago. He complains of intermittent headaches and lightheadedness, night sweats, mild cough, ringing in his ears, occasional vomiting and nausea. He is afebrile and vital signs are unremarkable here. He also notes occasional mild shortness of breath but denies any chest pain. He  notes that the dizziness is intermittent and he has a normal neurologic exam here. Will get screening labwork and imaging and treat headache with a migraine cocktail.  6:09 PM: Pt feeling much better. I interpreted/reviewed the labs and/or imaging which were non-contributory.  Likely viral syndrome.  I have discussed the diagnosis/risks/treatment options with the patient and believe the pt to be eligible for discharge home to follow-up with pcp in 2 days if no better. We also discussed returning to the ED immediately if new or worsening sx occur. We discussed the sx which are most concerning (e.g., worsening HA, fever, inability to tolerate po) that necessitate immediate return. Medications administered to the patient during their visit and any new prescriptions provided to the patient are listed below.  Medications given during this visit Medications  sodium chloride 0.9 % bolus 1,000 mL (0 mLs Intravenous Stopped 03/21/13 1753)  acetaminophen (TYLENOL) tablet 650 mg (650 mg Oral Given 03/21/13 1707)  metoCLOPramide (REGLAN) injection 5 mg (5 mg Intravenous Given 03/21/13 1708)  diphenhydrAMINE (BENADRYL) injection 25 mg (25 mg Intravenous Given 03/21/13 1708)  dexamethasone (DECADRON) injection 10 mg (10 mg Intravenous Given 03/21/13 1708)    New Prescriptions   No medications on file     Blanchard Kelch, MD 03/22/13 1703

## 2013-04-18 ENCOUNTER — Other Ambulatory Visit: Payer: Self-pay | Admitting: Family Medicine

## 2013-04-19 NOTE — Telephone Encounter (Signed)
Med filled.  

## 2013-06-01 ENCOUNTER — Other Ambulatory Visit: Payer: Self-pay | Admitting: Family Medicine

## 2013-06-03 NOTE — Telephone Encounter (Signed)
Med filled.  

## 2013-08-01 ENCOUNTER — Encounter: Payer: Self-pay | Admitting: Family Medicine

## 2013-08-01 ENCOUNTER — Ambulatory Visit (INDEPENDENT_AMBULATORY_CARE_PROVIDER_SITE_OTHER): Payer: Commercial Managed Care - HMO | Admitting: Family Medicine

## 2013-08-01 VITALS — BP 120/80 | HR 83 | Temp 98.0°F | Resp 16 | Wt 182.1 lb

## 2013-08-01 DIAGNOSIS — M545 Low back pain, unspecified: Secondary | ICD-10-CM | POA: Insufficient documentation

## 2013-08-01 DIAGNOSIS — E785 Hyperlipidemia, unspecified: Secondary | ICD-10-CM

## 2013-08-01 LAB — HEPATIC FUNCTION PANEL
ALT: 17 U/L (ref 0–53)
AST: 18 U/L (ref 0–37)
Albumin: 3.9 g/dL (ref 3.5–5.2)
Alkaline Phosphatase: 57 U/L (ref 39–117)
Bilirubin, Direct: 0.1 mg/dL (ref 0.0–0.3)
TOTAL PROTEIN: 7 g/dL (ref 6.0–8.3)
Total Bilirubin: 0.1 mg/dL — ABNORMAL LOW (ref 0.2–1.2)

## 2013-08-01 LAB — BASIC METABOLIC PANEL
BUN: 11 mg/dL (ref 6–23)
CALCIUM: 8.9 mg/dL (ref 8.4–10.5)
CO2: 29 meq/L (ref 19–32)
Chloride: 102 mEq/L (ref 96–112)
Creatinine, Ser: 1.4 mg/dL (ref 0.4–1.5)
GFR: 54.09 mL/min — AB (ref >60.00)
GLUCOSE: 89 mg/dL (ref 70–99)
Potassium: 3.7 mEq/L (ref 3.5–5.1)
Sodium: 137 mEq/L (ref 135–145)

## 2013-08-01 LAB — LIPID PANEL
CHOLESTEROL: 146 mg/dL (ref 0–200)
HDL: 27.3 mg/dL — AB (ref >39.00)
LDL Cholesterol: 87 mg/dL (ref 0–99)
NonHDL: 118.7
Total CHOL/HDL Ratio: 5
Triglycerides: 158 mg/dL — ABNORMAL HIGH (ref 0.0–149.0)
VLDL: 31.6 mg/dL (ref 0.0–40.0)

## 2013-08-01 MED ORDER — MELOXICAM 15 MG PO TABS: 15.0000 mg | ORAL_TABLET | Freq: Every day | ORAL | Status: DC

## 2013-08-01 MED ORDER — HYDROCODONE-ACETAMINOPHEN 5-325 MG PO TABS: 1.0000 | ORAL_TABLET | Freq: Four times a day (QID) | ORAL | Status: DC | PRN

## 2013-08-01 NOTE — Assessment & Plan Note (Signed)
Recurrent problem for pt.  Has hx of narcotic dependence but has been off all meds for 1 year.  Here today b/c pain is again severe and is not able to manage w/ Soma and Ibuprofen.  Start scheduled Mobic, continue Soma.  Short term hydrocodone to break pain cycle.  Refer to neurosurg for evaluation and ongoing management as pt would likely benefit from non-narcotic tx such as epidural injections.  Will follow.

## 2013-08-01 NOTE — Progress Notes (Signed)
   Subjective:    Patient ID: Dillon Singh, male    DOB: 08-Sep-1960, 53 y.o.   MRN: [id number]  HPI LBP- chronic problem for pt but he reports this has worsened in last 6-8 weeks.  Stopped taking pain meds last March.  Has had to stop walking due to severity of pain.  On Soma w/o relief.  Has seen neuro previously.  Pain will radiate into L leg.  Pain is a throbbing pain.  'incredible stiffness'- unable to lean forward or backward w/o difficulty.  Hyperlipidemia- chronic problem, on Atorvastatin.  Denies abd pain, N/V.  Due for labs.   Review of Systems For ROS see HPI     Objective:   Physical Exam  Vitals reviewed. Constitutional: He is oriented to person, place, and time. He appears well-developed and well-nourished.  Obviously uncomfortable  Cardiovascular: Normal rate, regular rhythm, normal heart sounds and intact distal pulses.   Pulmonary/Chest: Effort normal and breath sounds normal. No respiratory distress. He has no wheezes. He has no rales.  Abdominal: Soft. Bowel sounds are normal. He exhibits no distension. There is no tenderness. There is no rebound.  Musculoskeletal:  Pain w/ forward flexion and extension + TTP over lumbar spine and paraspinal muscles  Neurological: He is alert and oriented to person, place, and time.  + SLR on L          Assessment & Plan:

## 2013-08-01 NOTE — Assessment & Plan Note (Signed)
Chronic problem.  Tolerating statin w/o difficulty.  Check labs.  Adjust meds prn  

## 2013-08-01 NOTE — Patient Instructions (Signed)
Schedule your complete physical in 6 months Start the meloxicam daily for inflammation- take w/ food Use the hydrocodone for severe pain We'll call you with your neurosurg appt We'll notify you of your lab results and make any changes if needed Call with any questions or concerns Hang in there!!

## 2013-08-01 NOTE — Progress Notes (Signed)
Pre visit review using our clinic review tool, if applicable. No additional management support is needed unless otherwise documented below in the visit note. 

## 2013-08-02 ENCOUNTER — Telehealth: Payer: Self-pay | Admitting: Family Medicine

## 2013-08-02 NOTE — Telephone Encounter (Signed)
Relevant patient education assigned to patient using Emmi. ° °

## 2013-08-08 ENCOUNTER — Other Ambulatory Visit: Payer: Self-pay | Admitting: Family Medicine

## 2013-08-08 NOTE — Telephone Encounter (Signed)
Med filled and faxed.  

## 2013-08-08 NOTE — Telephone Encounter (Signed)
Last OV 08-01-13 Med filled 12-20-12 #90 with 3

## 2013-08-14 ENCOUNTER — Telehealth: Payer: Self-pay | Admitting: Family Medicine

## 2013-08-14 MED ORDER — HYDROCODONE-ACETAMINOPHEN 5-325 MG PO TABS: 1.0000 | ORAL_TABLET | Freq: Four times a day (QID) | ORAL | Status: DC | PRN

## 2013-08-14 NOTE — Telephone Encounter (Signed)
Caller name: Dillon Singh Relation to pt: patient Call back number: [phone] Pharmacy:  Reason for call: patient called to request a refill for HYDROcodone because his apt with Dr Vertell Limber is not until August 24 th. Please advise.

## 2013-08-14 NOTE — Telephone Encounter (Signed)
Pt can have #30 but needs to make these last- these are only for severe pain.  Also, pt had marijuana on drug screen and needs to STOP all illegal substances if he wants to get meds filled from this office

## 2013-08-14 NOTE — Telephone Encounter (Signed)
Called pt and made aware. He stated an understanding and says he COMPLETELY agrees that if he continues the Thc that we cannot continue filling his pain meds.

## 2013-08-14 NOTE — Telephone Encounter (Signed)
Last OV 08-01-13 Med filled same day #30 with 0

## 2013-08-21 ENCOUNTER — Encounter: Payer: Self-pay | Admitting: Family Medicine

## 2013-08-28 ENCOUNTER — Telehealth: Payer: Self-pay | Admitting: Family Medicine

## 2013-08-28 MED ORDER — HYDROCODONE-ACETAMINOPHEN 5-325 MG PO TABS: 1.0000 | ORAL_TABLET | Freq: Two times a day (BID) | ORAL | Status: DC | PRN

## 2013-08-28 NOTE — Telephone Encounter (Signed)
Spoke with the pt he states that he "does not want to take pain meds but feels as though he is backed into a corner". Pt is taking 1-2 Hydrocodone daily states that this med lasted him for 2 weeks. He is taking the soma, advises that the meloxicam is not making a difference does not feel any different taking it. Please advise.

## 2013-08-28 NOTE — Telephone Encounter (Signed)
Pt has Soma available.  He was given 30 hydrocodone 2 weeks ago- this is only to be used for severe pain and not regularly.  He told me he was off all pain meds and it seems like we're on a slippery slope here w/ narcotic use.  He can take Mobic 15mg  daily for pain and inflammation but we really need to try and steer clear of narcotics

## 2013-08-28 NOTE — Telephone Encounter (Signed)
Med filled, pt notified.

## 2013-08-28 NOTE — Telephone Encounter (Signed)
Caller name: Jafari Relation to pt: self Call back number:[phone]   Reason for call:   Pt can not get into see the specialist for his back pain until 10/14/13.  Pt would like something for the back pain. Has OV for 7/14.

## 2013-08-28 NOTE — Telephone Encounter (Signed)
Pt already on Mobic- so disregard that part of the message.  But the rest applies as written

## 2013-08-28 NOTE — Telephone Encounter (Signed)
[place] for #60, no refills- needs UDS since he was + for [facility] last check

## 2013-09-03 ENCOUNTER — Ambulatory Visit: Payer: Commercial Managed Care - HMO | Admitting: Family Medicine

## 2013-09-05 ENCOUNTER — Other Ambulatory Visit: Payer: Self-pay | Admitting: Family Medicine

## 2013-09-05 NOTE — Telephone Encounter (Signed)
Med filled.  

## 2013-09-13 ENCOUNTER — Encounter: Payer: Self-pay | Admitting: Family Medicine

## 2013-09-13 ENCOUNTER — Ambulatory Visit (INDEPENDENT_AMBULATORY_CARE_PROVIDER_SITE_OTHER): Payer: Commercial Managed Care - HMO | Admitting: Family Medicine

## 2013-09-13 VITALS — BP 130/82 | HR 80 | Temp 98.0°F | Resp 16 | Wt 181.5 lb

## 2013-09-13 DIAGNOSIS — M545 Low back pain, unspecified: Secondary | ICD-10-CM

## 2013-09-13 MED ORDER — HYDROCODONE-ACETAMINOPHEN 5-325 MG PO TABS: 1.0000 | ORAL_TABLET | Freq: Two times a day (BID) | ORAL | Status: DC | PRN

## 2013-09-13 NOTE — Progress Notes (Signed)
   Subjective:    Patient ID: Dillon Singh, male    DOB: 12-Apr-1960, 53 y.o.   MRN: [id number]  HPI Chronic LBP- pt reports pain is again severe and has appt upcoming w/ Dr Vertell Limber but it's not until August.  Over the last year, pt had weaned off all pain meds and was walking daily.  2 months ago pain worsened acutely and he had to stop walking.  Now requiring regular pain medication.  Pt having trouble sleeping, dressing, using the rest room comfortably.  Reports prior to worsening of pain, he did jump out of a tree- 'it was only a few feet'.  Last smoked marijuana 2 weeks ago.  Reports he's not addicted to it but can't explain why he would smoke 2 weeks ago after being told not to in order to get pain meds.   Review of Systems For ROS see HPI     Objective:   Physical Exam  Vitals reviewed. Constitutional: He is oriented to person, place, and time. He appears well-developed and well-nourished. No distress.  HENT:  Head: Normocephalic and atraumatic.  Neurological: He is alert and oriented to person, place, and time.  Skin: Skin is warm and dry.  Psychiatric: He has a normal mood and affect. His behavior is normal. Thought content normal.          Assessment & Plan:

## 2013-09-13 NOTE — Progress Notes (Signed)
Pre visit review using our clinic review tool, if applicable. No additional management support is needed unless otherwise documented below in the visit note. 

## 2013-09-13 NOTE — Patient Instructions (Signed)
Follow up as needed We'll call you with your MRI STOP using marijuana if you want to continue to receive pain meds Call with any questions or concerns Hang in there!

## 2013-09-15 NOTE — Assessment & Plan Note (Signed)
Deteriorated.  Pt has appt upcoming w/ neurosurg but having difficulty managing his pain.  Will get MRI.  Had serious discussion w/ pt that he must stop smoking Marijuana if he is going to continue to receive pain meds.  Pt admits to smoking 2 weeks ago but unable to explain why when he knew this would void his controlled substance agreement.  Will give pt 30 hydrocodone but he will need another UDS prior to getting another refill.

## 2013-09-21 ENCOUNTER — Ambulatory Visit (HOSPITAL_BASED_OUTPATIENT_CLINIC_OR_DEPARTMENT_OTHER)
Admission: RE | Admit: 2013-09-21 | Discharge: 2013-09-21 | Disposition: A | Discharge status: Home / Self Care | Payer: Medicare HMO | Source: Ambulatory Visit | Attending: Family Medicine | Admitting: Family Medicine

## 2013-09-21 DIAGNOSIS — M545 Low back pain, unspecified: Secondary | ICD-10-CM | POA: Diagnosis not present

## 2013-09-23 ENCOUNTER — Telehealth: Payer: Self-pay | Admitting: Family Medicine

## 2013-09-23 NOTE — Telephone Encounter (Signed)
Per last OV note from [place] pt is in need of UDS before any more pain med refills.    Last OV 09/13/13 Hydrocodone last filled same day #30 with 0  Please advise.

## 2013-09-23 NOTE — Telephone Encounter (Signed)
Caller name: [place] Relation to pt: Call back [phone]   Reason for call:  Pt called back about Rx hydrocodone.  Please call back

## 2013-09-24 NOTE — Telephone Encounter (Signed)
medicatin has not been approved by provider yet.

## 2013-09-24 NOTE — Telephone Encounter (Signed)
Pt called back about medication and I advised him that he needs to give a urine specimen before the medication will be filled. He stated "ok" and that he will callback.

## 2013-09-25 NOTE — Telephone Encounter (Signed)
Waiting on pt to give urine sample.

## 2013-09-25 NOTE — Telephone Encounter (Signed)
Need to see UDS results prior to refilling medication

## 2013-09-26 NOTE — Telephone Encounter (Signed)
Informed pt that UDS had to be read before script can be written.  Pt understood.  FYI

## 2013-09-26 NOTE — Telephone Encounter (Signed)
Pt is coming in today to do UDS

## 2013-09-27 MED ORDER — HYDROCODONE-ACETAMINOPHEN 5-325 MG PO TABS: 1.0000 | ORAL_TABLET | Freq: Two times a day (BID) | ORAL | Status: DC | PRN

## 2013-09-27 NOTE — Telephone Encounter (Signed)
Pt wants to speak with RN or CMA for Dr. Birdie Riddle.  Please call [phone]

## 2013-09-27 NOTE — Telephone Encounter (Signed)
Spoke with patient who stated that he could not understand why he has not received a refill on his pain medication yet.  States that he has been in pain all week and needs his pain medication.  Made patient aware that we are still waiting on the results of his UDS.  Pt stated that he provided a urine sample yesterday.  Again, he was told that his UDS has not been resulted yet.  Pt has a history of UDS being positive for marijuana thus making him a high risk.  Spoke with Dr. Birdie Riddle about patient's complaint.    Verbal order given to provide patient HYDROcodone-acetaminophen (NORCO/VICODIN) 5-325 MG #15, 0 refills pending patient's UDS results.  Pt was informed that if in the event his UDS is positive for marijuana, he will no longer receive controlled substance from Dr. Birdie Riddle.  Pt stated understanding and agreed.

## 2013-10-02 ENCOUNTER — Other Ambulatory Visit: Payer: Self-pay | Admitting: Family Medicine

## 2013-10-02 NOTE — Telephone Encounter (Signed)
Med faxed   

## 2013-10-02 NOTE — Telephone Encounter (Signed)
Last OV 09-13-13 Med filled 08-08-13 #90 with 0

## 2013-10-02 NOTE — Telephone Encounter (Signed)
Med filled and faxed.  

## 2013-10-08 ENCOUNTER — Other Ambulatory Visit: Payer: Self-pay | Admitting: Family Medicine

## 2013-10-08 NOTE — Telephone Encounter (Signed)
Last OV 09-13-13 Soma last filled 10-02-13 #90 with 1. SIG: TAKE 1 TABLET BY MOUTH 3 TIMES A DAY  Called pharmacy and they verified this was on hold. Were releasing to pt. Medication denied.

## 2013-10-09 ENCOUNTER — Telehealth: Payer: Self-pay | Admitting: Family Medicine

## 2013-10-09 MED ORDER — HYDROCODONE-ACETAMINOPHEN 5-325 MG PO TABS: 1.0000 | ORAL_TABLET | Freq: Two times a day (BID) | ORAL | Status: DC | PRN

## 2013-10-09 NOTE — Telephone Encounter (Signed)
[place] for #60, no refills 

## 2013-10-09 NOTE — Telephone Encounter (Signed)
Caller name: [place]  Relation to pt: self  Call back number: [phone] Pharmacy: [facility] [phone]   Reason for call:   experience back pain for the last 2 month pt has an appointment with neuro surg Dr. Vertell Limber Monday 10/14/13. Pt requesting pain medication to hold him over until appointmnt with neuro doctor.

## 2013-10-09 NOTE — Telephone Encounter (Signed)
Med filled.  

## 2013-10-15 ENCOUNTER — Encounter: Payer: Self-pay | Admitting: Family Medicine

## 2013-10-17 ENCOUNTER — Telehealth: Payer: Self-pay

## 2013-10-17 DIAGNOSIS — M549 Dorsalgia, unspecified: Secondary | ICD-10-CM

## 2013-10-17 NOTE — Telephone Encounter (Signed)
Dillon Singh from [facility] office called to see if they could get a referral for Dillon Singh to see him at [facility] Dr Maryjean Ka NPI [phone] Dillon Singh has [place]

## 2013-10-17 NOTE — Telephone Encounter (Signed)
Yes- he can have referral but please verify that pt has been dismissed

## 2013-10-17 NOTE — Telephone Encounter (Signed)
Referral placed. Dismissal form given to office manager.

## 2013-10-18 ENCOUNTER — Telehealth: Payer: Self-pay | Admitting: Family Medicine

## 2013-10-18 NOTE — Telephone Encounter (Signed)
Dismissal Letter sent by [employer] Mail [phone]  Received the Return Receipt showing someone picked up the Dismissal 10/21/2013

## 2013-12-16 ENCOUNTER — Encounter: Payer: Self-pay | Admitting: Physical Medicine & Rehabilitation

## 2014-01-06 ENCOUNTER — Ambulatory Visit: Payer: Commercial Managed Care - HMO | Admitting: Physical Medicine & Rehabilitation

## 2014-02-11 ENCOUNTER — Telehealth: Payer: Self-pay | Admitting: *Deleted

## 2014-02-11 NOTE — Telephone Encounter (Signed)
Received medical record request via fax from [facility]. Forwarded to health port. [id number]

## 2014-03-03 DIAGNOSIS — R972 Elevated prostate specific antigen [PSA]: Secondary | ICD-10-CM | POA: Diagnosis not present

## 2014-03-10 DIAGNOSIS — R972 Elevated prostate specific antigen [PSA]: Secondary | ICD-10-CM | POA: Diagnosis not present

## 2014-03-10 DIAGNOSIS — M5136 Other intervertebral disc degeneration, lumbar region: Secondary | ICD-10-CM | POA: Diagnosis not present

## 2014-03-10 DIAGNOSIS — E782 Mixed hyperlipidemia: Secondary | ICD-10-CM | POA: Diagnosis not present

## 2014-03-10 DIAGNOSIS — M797 Fibromyalgia: Secondary | ICD-10-CM | POA: Diagnosis not present

## 2014-06-05 DIAGNOSIS — M549 Dorsalgia, unspecified: Secondary | ICD-10-CM | POA: Diagnosis not present

## 2014-06-12 DIAGNOSIS — M797 Fibromyalgia: Secondary | ICD-10-CM | POA: Diagnosis not present

## 2014-06-12 DIAGNOSIS — F1120 Opioid dependence, uncomplicated: Secondary | ICD-10-CM | POA: Diagnosis not present

## 2014-06-12 DIAGNOSIS — M25552 Pain in left hip: Secondary | ICD-10-CM | POA: Diagnosis not present

## 2014-06-30 DIAGNOSIS — M25512 Pain in left shoulder: Secondary | ICD-10-CM | POA: Diagnosis not present

## 2014-06-30 DIAGNOSIS — M7062 Trochanteric bursitis, left hip: Secondary | ICD-10-CM | POA: Diagnosis not present

## 2014-07-22 DIAGNOSIS — M25412 Effusion, left shoulder: Secondary | ICD-10-CM | POA: Diagnosis not present

## 2014-07-22 DIAGNOSIS — J060 Acute laryngopharyngitis: Secondary | ICD-10-CM | POA: Diagnosis not present

## 2014-07-22 DIAGNOSIS — M7062 Trochanteric bursitis, left hip: Secondary | ICD-10-CM | POA: Diagnosis not present

## 2014-09-08 DIAGNOSIS — M797 Fibromyalgia: Secondary | ICD-10-CM | POA: Diagnosis not present

## 2014-09-08 DIAGNOSIS — R972 Elevated prostate specific antigen [PSA]: Secondary | ICD-10-CM | POA: Diagnosis not present

## 2014-09-15 DIAGNOSIS — H2510 Age-related nuclear cataract, unspecified eye: Secondary | ICD-10-CM | POA: Diagnosis not present

## 2014-09-15 DIAGNOSIS — H5210 Myopia, unspecified eye: Secondary | ICD-10-CM | POA: Diagnosis not present

## 2014-09-15 DIAGNOSIS — H524 Presbyopia: Secondary | ICD-10-CM | POA: Diagnosis not present

## 2014-09-16 DIAGNOSIS — M5136 Other intervertebral disc degeneration, lumbar region: Secondary | ICD-10-CM | POA: Diagnosis not present

## 2014-09-16 DIAGNOSIS — E782 Mixed hyperlipidemia: Secondary | ICD-10-CM | POA: Diagnosis not present

## 2014-09-16 DIAGNOSIS — R825 Elevated urine levels of drugs, medicaments and biological substances: Secondary | ICD-10-CM | POA: Diagnosis not present

## 2014-10-28 DIAGNOSIS — A938 Other specified arthropod-borne viral fevers: Secondary | ICD-10-CM | POA: Diagnosis not present

## 2014-10-28 DIAGNOSIS — T07 Unspecified multiple injuries: Secondary | ICD-10-CM | POA: Diagnosis not present

## 2015-01-14 DIAGNOSIS — S335XXA Sprain of ligaments of lumbar spine, initial encounter: Secondary | ICD-10-CM | POA: Diagnosis not present

## 2015-01-14 DIAGNOSIS — M5136 Other intervertebral disc degeneration, lumbar region: Secondary | ICD-10-CM | POA: Diagnosis not present

## 2015-01-22 DIAGNOSIS — E291 Testicular hypofunction: Secondary | ICD-10-CM | POA: Diagnosis not present

## 2015-01-22 DIAGNOSIS — E559 Vitamin D deficiency, unspecified: Secondary | ICD-10-CM | POA: Diagnosis not present

## 2015-01-22 DIAGNOSIS — E039 Hypothyroidism, unspecified: Secondary | ICD-10-CM | POA: Diagnosis not present

## 2015-01-22 DIAGNOSIS — E236 Other disorders of pituitary gland: Secondary | ICD-10-CM | POA: Diagnosis not present

## 2015-01-22 DIAGNOSIS — M899 Disorder of bone, unspecified: Secondary | ICD-10-CM | POA: Diagnosis not present

## 2015-01-30 DIAGNOSIS — M62830 Muscle spasm of back: Secondary | ICD-10-CM | POA: Diagnosis not present

## 2015-01-30 DIAGNOSIS — M549 Dorsalgia, unspecified: Secondary | ICD-10-CM | POA: Diagnosis not present

## 2015-05-12 DIAGNOSIS — M549 Dorsalgia, unspecified: Secondary | ICD-10-CM | POA: Diagnosis not present

## 2015-05-12 DIAGNOSIS — M25552 Pain in left hip: Secondary | ICD-10-CM | POA: Diagnosis not present

## 2015-05-12 DIAGNOSIS — E039 Hypothyroidism, unspecified: Secondary | ICD-10-CM | POA: Diagnosis not present

## 2015-05-12 DIAGNOSIS — L739 Follicular disorder, unspecified: Secondary | ICD-10-CM | POA: Diagnosis not present

## 2015-06-09 DIAGNOSIS — E039 Hypothyroidism, unspecified: Secondary | ICD-10-CM | POA: Diagnosis not present

## 2015-06-09 DIAGNOSIS — E782 Mixed hyperlipidemia: Secondary | ICD-10-CM | POA: Diagnosis not present

## 2015-06-09 DIAGNOSIS — R972 Elevated prostate specific antigen [PSA]: Secondary | ICD-10-CM | POA: Diagnosis not present

## 2015-06-16 DIAGNOSIS — M5136 Other intervertebral disc degeneration, lumbar region: Secondary | ICD-10-CM | POA: Diagnosis not present

## 2015-06-16 DIAGNOSIS — E039 Hypothyroidism, unspecified: Secondary | ICD-10-CM | POA: Diagnosis not present

## 2015-06-16 DIAGNOSIS — M797 Fibromyalgia: Secondary | ICD-10-CM | POA: Diagnosis not present

## 2015-06-16 DIAGNOSIS — E782 Mixed hyperlipidemia: Secondary | ICD-10-CM | POA: Diagnosis not present

## 2015-10-29 DIAGNOSIS — Z23 Encounter for immunization: Secondary | ICD-10-CM | POA: Diagnosis not present

## 2015-10-29 DIAGNOSIS — M1990 Unspecified osteoarthritis, unspecified site: Secondary | ICD-10-CM | POA: Diagnosis not present

## 2016-01-22 DIAGNOSIS — E559 Vitamin D deficiency, unspecified: Secondary | ICD-10-CM | POA: Diagnosis not present

## 2016-01-22 DIAGNOSIS — E236 Other disorders of pituitary gland: Secondary | ICD-10-CM | POA: Diagnosis not present

## 2016-01-22 DIAGNOSIS — E291 Testicular hypofunction: Secondary | ICD-10-CM | POA: Diagnosis not present

## 2016-01-22 DIAGNOSIS — E039 Hypothyroidism, unspecified: Secondary | ICD-10-CM | POA: Diagnosis not present

## 2016-01-26 DIAGNOSIS — M797 Fibromyalgia: Secondary | ICD-10-CM | POA: Diagnosis not present

## 2016-01-26 DIAGNOSIS — M5136 Other intervertebral disc degeneration, lumbar region: Secondary | ICD-10-CM | POA: Diagnosis not present

## 2016-03-08 DIAGNOSIS — M797 Fibromyalgia: Secondary | ICD-10-CM | POA: Diagnosis not present

## 2016-04-16 DIAGNOSIS — M5416 Radiculopathy, lumbar region: Secondary | ICD-10-CM | POA: Diagnosis not present

## 2016-04-19 DIAGNOSIS — M48061 Spinal stenosis, lumbar region without neurogenic claudication: Secondary | ICD-10-CM | POA: Diagnosis not present

## 2016-04-19 DIAGNOSIS — M5442 Lumbago with sciatica, left side: Secondary | ICD-10-CM | POA: Diagnosis not present

## 2016-04-19 DIAGNOSIS — M5116 Intervertebral disc disorders with radiculopathy, lumbar region: Secondary | ICD-10-CM | POA: Diagnosis not present

## 2016-04-23 ENCOUNTER — Emergency Department
Admission: EM | Admit: 2016-04-23 | Discharge: 2016-04-23 | Disposition: A | Discharge status: Home / Self Care | Payer: Medicare HMO | Attending: Emergency Medicine | Admitting: Emergency Medicine

## 2016-04-23 ENCOUNTER — Encounter: Payer: Self-pay | Admitting: Emergency Medicine

## 2016-04-23 DIAGNOSIS — M5442 Lumbago with sciatica, left side: Secondary | ICD-10-CM | POA: Diagnosis not present

## 2016-04-23 DIAGNOSIS — M545 Low back pain: Secondary | ICD-10-CM | POA: Diagnosis present

## 2016-04-23 DIAGNOSIS — F17210 Nicotine dependence, cigarettes, uncomplicated: Secondary | ICD-10-CM | POA: Diagnosis not present

## 2016-04-23 DIAGNOSIS — Z79899 Other long term (current) drug therapy: Secondary | ICD-10-CM | POA: Diagnosis not present

## 2016-04-23 DIAGNOSIS — E039 Hypothyroidism, unspecified: Secondary | ICD-10-CM | POA: Insufficient documentation

## 2016-04-23 MED ORDER — HYDROMORPHONE HCL 1 MG/ML IJ SOLN
INTRAMUSCULAR | Status: DC
Start: 2016-04-23 — End: 2016-04-24
  Filled 2016-04-23: qty 1

## 2016-04-23 MED ORDER — HYDROMORPHONE HCL 1 MG/ML IJ SOLN
1.0000 mg | Freq: Once | INTRAMUSCULAR | Status: AC
  Administered 2016-04-23: 1 mg via INTRAVENOUS

## 2016-04-23 MED ORDER — OXYCODONE HCL 5 MG PO TABS: 5.0000 mg | ORAL_TABLET | ORAL | 0 refills | Status: DC | PRN

## 2016-04-23 MED ORDER — KETOROLAC TROMETHAMINE 30 MG/ML IJ SOLN
30.0000 mg | Freq: Once | INTRAMUSCULAR | Status: AC
  Administered 2016-04-23: 30 mg via INTRAVENOUS
  Filled 2016-04-23: qty 1

## 2016-04-23 MED ORDER — DEXAMETHASONE SODIUM PHOSPHATE 10 MG/ML IJ SOLN
10.0000 mg | Freq: Once | INTRAMUSCULAR | Status: AC
  Administered 2016-04-23: 10 mg via INTRAVENOUS
  Filled 2016-04-23: qty 1

## 2016-04-23 MED ORDER — HYDROMORPHONE HCL 1 MG/ML IJ SOLN
1.0000 mg | Freq: Once | INTRAMUSCULAR | Status: AC
Start: 2016-04-23 — End: 2016-04-23
  Administered 2016-04-23: 1 mg via INTRAVENOUS
  Filled 2016-04-23: qty 1

## 2016-04-23 NOTE — ED Provider Notes (Signed)
[facility] Provider Note   CSN: [id number] Arrival date & time: 04/23/16  1806     History   Chief Complaint Chief Complaint  Patient presents with  . Back Pain    HPI Dillon Singh is a 56 y.o. male presents to the emergency department for evaluation of severe lower back and left leg pain. He denies any trauma or injury. Has a history of lumbar radiculopathy. Has had a series of the assigned injections one year ago that helped. Was doing well up until 2 weeks ago he noticed increasing pain. Saw PCP was started on a steroid taper, currently on a taper. He is chronically taking Lyrica 100 mg twice a day as well as tizanidine and tramadol 50 mg 3 times a day. His pain is severe. Denies any loss of bowel or bladder symptoms. He relates an assisted devices.  HPI  Past Medical History:  Diagnosis Date  . Back pain   . Erectile dysfunction   . Hip pain   . Hyperlipidemia   . Hypopituitarism ([place])   . Hypothyroidism     Patient Active Problem List   Diagnosis Date Noted  . Lumbar back pain 08/01/2013  . Viral syndrome 03/21/2013  . Anxiety 11/17/2011  . General medical examination 03/30/2011  . Skin lesion 09/13/2010  . HYPERLIPIDEMIA 08/12/2009  . ERECTILE DYSFUNCTION 08/15/2008  . PAIN IN JOINT, MULTIPLE SITES 08/15/2008  . DEPRESSIVE DISORDER 07/17/2008  . FIBROMYALGIA 07/17/2008  . RASH-NONVESICULAR 07/17/2008  . INSOMNIA-SLEEP DISORDER-UNSPEC 06/17/2008  . TOBACCO USE 04/03/2008  . NECK MASS 03/11/2008  . ACUTE LYMPHADENITIS 02/26/2008  . HYPOTHYROIDISM 02/04/2008  . 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 BACK PAIN, CHRONIC 02/04/2008  . DIZZINESS 02/04/2008    Past Surgical History:  Procedure Laterality Date  . APPENDECTOMY    . 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 Authorizing Provider  atorvastatin (LIPITOR) 20 MG  tablet TAKE 1 TABLET BY MOUTH AT BEDTIME.    Midge Minium, MD  carisoprodol (SOMA) 350 MG tablet TAKE 1 TABLET BY MOUTH 3 TIMES A DAY 10/02/13   Midge Minium, MD  gabapentin (NEURONTIN) 100 MG capsule Take 100 mg by mouth at bedtime. PT ADVISED TO TAKE 2 TABLETS AT BEDTIME    Historical Provider, MD  halobetasol (ULTRAVATE) 0.05 % cream daily.      Historical Provider, MD  HYDROcodone-acetaminophen (NORCO/VICODIN) 5-325 MG per tablet Take 1 tablet by mouth 2 (two) times daily as needed for moderate pain. 10/09/13   Midge Minium, MD  ibuprofen (ADVIL,MOTRIN) 200 MG tablet Take 200 mg by mouth as needed.      Historical Provider, MD  levothyroxine (SYNTHROID, LEVOTHROID) 112 MCG tablet Take 112 mcg by mouth daily before breakfast.    Historical Provider, MD  LORazepam (ATIVAN) 0.5 MG tablet 1-2 tabs prior to dental procedure 11/17/11   Midge Minium, MD  meloxicam (MOBIC) 15 MG tablet Take 1 tablet (15 mg total) by mouth daily. 08/01/13   Midge Minium, MD  oxyCODONE (ROXICODONE) 5 MG immediate release tablet Take 1 tablet (5 mg total) by mouth every 4 (four) hours as needed. 04/23/16 04/23/17  Duanne Guess, PA-C  tadalafil (CIALIS) 5 MG tablet 1 tab daily 04/15/11   Midge Minium, MD  Testosterone 1.25 GM/ACT (1%) GEL Place onto the skin 2 (  two) times daily.      Historical Provider, MD  venlafaxine XR (EFFEXOR-XR) 150 MG 24 hr capsule TAKE ONE CAPSULE BY MOUTH EVERY DAY 09/05/13   Midge Minium, MD    Family History Family History  Problem Relation Age of Onset  . Diabetes Mother     Social History Social History  Substance Use Topics  . Smoking status: Current Every Day Smoker    Packs/day: 1.00    Types: Cigarettes  . Smokeless tobacco: Not on file  . Alcohol use No     Allergies   Patient has no known allergies.   Review of Systems Review of Systems  Constitutional: Negative.  Negative for activity change, appetite change, chills and fever.    HENT: Negative for congestion, ear pain, mouth sores, rhinorrhea, sinus pressure, sore throat and trouble swallowing.   Eyes: Negative for photophobia, pain and discharge.  Respiratory: Negative for cough, chest tightness and shortness of breath.   Cardiovascular: Negative for chest pain and leg swelling.  Gastrointestinal: Negative for abdominal distention, abdominal pain, diarrhea, nausea and vomiting.  Genitourinary: Negative for difficulty urinating and dysuria.  Musculoskeletal: Positive for back pain. Negative for arthralgias and gait problem.  Skin: Negative for color change and rash.  Neurological: Positive for numbness. Negative for dizziness, weakness and headaches.  Hematological: Negative for adenopathy.  Psychiatric/Behavioral: Negative for agitation and behavioral problems.     Physical Exam Updated Vital Signs BP 127/78   Pulse 84   Temp 98.1 F (36.7 C) (Oral)   Resp 20   Ht 5\' 10"  (1.778 m)   Wt 79.4 kg   SpO2 97%   BMI 25.11 kg/m   Physical Exam  Constitutional: He is oriented to person, place, and time. He appears well-developed and well-nourished.  HENT:  Head: Normocephalic and atraumatic.  Eyes: Conjunctivae and EOM are normal. Pupils are equal, round, and reactive to light.  Neck: Normal range of motion. Neck supple.  Cardiovascular: Normal rate, regular rhythm, normal heart sounds and intact distal pulses.   Pulmonary/Chest: Effort normal and breath sounds normal. No respiratory distress. He has no wheezes. He has no rales. He exhibits no tenderness.  Abdominal: Soft. Bowel sounds are normal. He exhibits no distension. There is no tenderness.  Musculoskeletal:  Lumbar Spine: Examination of the lumbar spine reveals no bony abnormality, no edema, and no ecchymosis.  There is no step off.  The patient has decreased range of motion of the lumbar spine with flexion and extension.  The patient has normal lateral bend and rotation.  The patient has pain with  examination and extension.  The patient is non tender along the spinous process.  The patient is non tender along the paravertebral muscles, with no muscle spasms.  The patient is non tender along the iliac crest.  The patient is non tender in the sciatic notch.  The patient is non tender along the Sacroiliac joint.  There is no Coccyx joint tenderness.    Bilateral Lower Extremities: Examination of the lower extremities reveals no bony abnormality, no edema, and no ecchymosis.  The patient has full active and passive range of motion of the hips, knees, and ankles.  There is no discomfort with range of motion exercises.  The patient is non tender along the greater trochanter region.  The patient has a negative Bevelyn Buckles' test bilaterally.  There is normal skin warmth.  There is normal capillary refill bilaterally.    Neurologic: The patient has a positive left straight  leg raise.  The patient has normal muscle strength testing for the quadriceps, calves, ankle dorsiflexion, ankle plantarflexion, and extensor hallicus longus.  The patient has sensation that is intact to light touch.  The deep tendon reflexes are normal bilaterally at patella and Achilles with no clonus noted  Neurological: He is alert and oriented to person, place, and time.  Skin: Skin is warm and dry.  Psychiatric: He has a normal mood and affect. His behavior is normal. Judgment and thought content normal.     ED Treatments / Results  Labs (all labs ordered are listed, but only abnormal results are displayed) Labs Reviewed - No data to display  EKG  EKG Interpretation None       Radiology No results found.  Procedures Procedures (including critical care time)  Medications Ordered in ED Medications  HYDROmorphone (DILAUDID) 1 MG/ML injection (not administered)  ketorolac (TORADOL) 30 MG/ML injection 30 mg (30 mg Intravenous Given 04/23/16 1931)  dexamethasone (DECADRON) injection 10 mg (10 mg Intravenous Given 04/23/16  1931)  HYDROmorphone (DILAUDID) injection 1 mg (1 mg Intravenous Given 04/23/16 1931)  HYDROmorphone (DILAUDID) injection 1 mg (1 mg Intravenous Given 04/23/16 2034)     Initial Impression / Assessment and Plan / ED Course  I have reviewed the triage vital signs and the nursing notes.  Pertinent labs & imaging results that were available during my care of the patient were reviewed by me and considered in my medical decision making (see chart for details).     56 year old male with acute left lumbar radiculopathy. No neurological deficits. Ambulatory with assistive devices. Patient's pain significant improved with IV medications in the ED. He is sent home with oxycodone. We'll continue with tizanidine and Lyrica as needed. He will call orthopedics and return to the ER for any worsening symptoms urgent changes in his health.  Final Clinical Impressions(s) / ED Diagnoses   Final diagnoses:  Acute left-sided low back pain with left-sided sciatica    New Prescriptions New Prescriptions   OXYCODONE (ROXICODONE) 5 MG IMMEDIATE RELEASE TABLET    Take 1 tablet (5 mg total) by mouth every 4 (four) hours as needed.     Duanne Guess, PA-C 04/23/16 2117    Nance Pear, MD 04/23/16 2245

## 2016-04-23 NOTE — Discharge Instructions (Signed)
Please continue with Lyrica, tizanidine and started oxycodone as needed for severe pain. Return to the ER for any weakness or loss of bowel or bladder symptoms. Call orthopedic office Monday morning to schedule follow-up appointment.

## 2016-04-23 NOTE — ED Notes (Signed)
See triage note. Pt states worsened low back pain, radiating down L leg. Hx bulging disk x12-15 years. Pt saw PCP 1 week ago, was prescribed tizanidine, tramadol, and prednisone. States still no relief.

## 2016-04-23 NOTE — ED Triage Notes (Signed)
Low back pain x 2 weeks, denies fall or injury at onset.

## 2016-04-25 ENCOUNTER — Emergency Department
Admission: EM | Admit: 2016-04-25 | Discharge: 2016-04-25 | Disposition: A | Discharge status: Home / Self Care | Payer: Medicare HMO | Attending: Emergency Medicine | Admitting: Emergency Medicine

## 2016-04-25 ENCOUNTER — Emergency Department: Payer: Medicare HMO

## 2016-04-25 ENCOUNTER — Encounter: Payer: Self-pay | Admitting: Emergency Medicine

## 2016-04-25 DIAGNOSIS — Z79899 Other long term (current) drug therapy: Secondary | ICD-10-CM | POA: Insufficient documentation

## 2016-04-25 DIAGNOSIS — F17210 Nicotine dependence, cigarettes, uncomplicated: Secondary | ICD-10-CM | POA: Insufficient documentation

## 2016-04-25 DIAGNOSIS — M79605 Pain in left leg: Secondary | ICD-10-CM

## 2016-04-25 DIAGNOSIS — R202 Paresthesia of skin: Secondary | ICD-10-CM | POA: Insufficient documentation

## 2016-04-25 DIAGNOSIS — E039 Hypothyroidism, unspecified: Secondary | ICD-10-CM | POA: Diagnosis not present

## 2016-04-25 DIAGNOSIS — M5416 Radiculopathy, lumbar region: Secondary | ICD-10-CM | POA: Diagnosis not present

## 2016-04-25 DIAGNOSIS — M545 Low back pain, unspecified: Secondary | ICD-10-CM

## 2016-04-25 DIAGNOSIS — M79609 Pain in unspecified limb: Secondary | ICD-10-CM

## 2016-04-25 MED ORDER — FAMOTIDINE 20 MG PO TABS: 20.0000 mg | ORAL_TABLET | Freq: Two times a day (BID) | ORAL | 0 refills | Status: DC

## 2016-04-25 MED ORDER — PREDNISONE 20 MG PO TABS: 40.0000 mg | ORAL_TABLET | Freq: Every day | ORAL | 0 refills | Status: DC

## 2016-04-25 MED ORDER — OXYCODONE-ACETAMINOPHEN 5-325 MG PO TABS
2.0000 | ORAL_TABLET | Freq: Once | ORAL | Status: AC
Start: 2016-04-25 — End: 2016-04-25
  Administered 2016-04-25: 2 via ORAL
  Filled 2016-04-25: qty 2

## 2016-04-25 MED ORDER — OXYCODONE-ACETAMINOPHEN 5-325 MG PO TABS
ORAL_TABLET | ORAL | Status: AC
  Filled 2016-04-25: qty 2

## 2016-04-25 MED ORDER — KETOROLAC TROMETHAMINE 60 MG/2ML IM SOLN
15.0000 mg | Freq: Once | INTRAMUSCULAR | Status: AC
  Administered 2016-04-25: 15 mg via INTRAMUSCULAR
  Filled 2016-04-25: qty 2

## 2016-04-25 MED ORDER — DEXAMETHASONE SODIUM PHOSPHATE 10 MG/ML IJ SOLN
10.0000 mg | Freq: Once | INTRAMUSCULAR | Status: AC
  Administered 2016-04-25: 10 mg via INTRAMUSCULAR
  Filled 2016-04-25: qty 1

## 2016-04-25 MED ORDER — NAPROXEN 500 MG PO TABS: 500.0000 mg | ORAL_TABLET | Freq: Two times a day (BID) | ORAL | 0 refills | Status: DC

## 2016-04-25 NOTE — ED Triage Notes (Signed)
C/O back pain x 2 weeks, left leg numbness x 2 weeks.  Unable to sleep since Thursday.  States has had a buldged disc for many years.  Has been taking oxycodone 5 mg for pain, no improvement.  Seen through ED on Saturday for same complaint, pain not improving.

## 2016-04-25 NOTE — Discharge Instructions (Signed)
MRI did not show any acute changes or severe nerve impingement. Follow-up with neurosurgery for further evaluation of your low back pain. Follow-up with your doctor for further pain management. Take steroids and anti-inflammatories. Take a antacid to protect your stomach from side effects of the steroids and anti-inflammatories.

## 2016-04-25 NOTE — ED Provider Notes (Signed)
[facility] Emergency Department Provider Note  ____________________________________________  Time seen: Approximately 12:06 PM  I have reviewed the triage vital signs and the nursing notes.   HISTORY  Chief Complaint Back Pain    HPI Dillon Singh is a 56 y.o. male who complains of left lower back pain radiating into the anterior left thigh with inner thigh tingling. This is been worse over the past 2 weeks, he is unable to find any relief from the pain. It is keeping him up and preventing sleep. Denies fever or chills. No acute injuries. Has chronic low back pain from bulging disks for the past 15 years at least. Denies any bowel or bladder retention or incontinence. No motor weakness. In the ED 2days agofor similar symptoms, was given oxycodone without relief. He is currently also taking a prednisone taper. Takes Lyrica for the pain as well.     Past Medical History:  Diagnosis Date  . Back pain   . Erectile dysfunction   . Hip pain   . Hyperlipidemia   . Hypopituitarism ([place])   . Hypothyroidism      Patient Active Problem List   Diagnosis Date Noted  . Lumbar back pain 08/01/2013  . Viral syndrome 03/21/2013  . Anxiety 11/17/2011  . General medical examination 03/30/2011  . Skin lesion 09/13/2010  . HYPERLIPIDEMIA 08/12/2009  . ERECTILE DYSFUNCTION 08/15/2008  . PAIN IN JOINT, MULTIPLE SITES 08/15/2008  . DEPRESSIVE DISORDER 07/17/2008  . FIBROMYALGIA 07/17/2008  . RASH-NONVESICULAR 07/17/2008  . INSOMNIA-SLEEP DISORDER-UNSPEC 06/17/2008  . TOBACCO USE 04/03/2008  . NECK MASS 03/11/2008  . ACUTE LYMPHADENITIS 02/26/2008  . HYPOTHYROIDISM 02/04/2008  . 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 BACK PAIN, CHRONIC 02/04/2008  . DIZZINESS 02/04/2008     Past Surgical History:  Procedure Laterality Date  . APPENDECTOMY    . 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 Authorizing Provider  atorvastatin (LIPITOR) 20 MG tablet TAKE 1 TABLET BY MOUTH AT BEDTIME.    Midge Minium, MD  carisoprodol (SOMA) 350 MG tablet TAKE 1 TABLET BY MOUTH 3 TIMES A DAY 10/02/13   Midge Minium, MD  famotidine (PEPCID) 20 MG tablet Take 1 tablet (20 mg total) by mouth 2 (two) times daily. 04/25/16   Carrie Mew, MD  gabapentin (NEURONTIN) 100 MG capsule Take 100 mg by mouth at bedtime. PT ADVISED TO TAKE 2 TABLETS AT BEDTIME    Historical Provider, MD  halobetasol (ULTRAVATE) 0.05 % cream daily.      Historical Provider, MD  HYDROcodone-acetaminophen (NORCO/VICODIN) 5-325 MG per tablet Take 1 tablet by mouth 2 (two) times daily as needed for moderate pain. 10/09/13   Midge Minium, MD  ibuprofen (ADVIL,MOTRIN) 200 MG tablet Take 200 mg by mouth as needed.      Historical Provider, MD  levothyroxine (SYNTHROID, LEVOTHROID) 112 MCG tablet Take 112 mcg by mouth daily before breakfast.    Historical Provider, MD  LORazepam (ATIVAN) 0.5 MG tablet 1-2 tabs prior to dental procedure 11/17/11   Midge Minium, MD  meloxicam (MOBIC) 15 MG tablet Take 1 tablet (15 mg total) by mouth daily. 08/01/13   Midge Minium, MD  naproxen (NAPROSYN) 500 MG tablet Take 1 tablet (500 mg total) by mouth 2 (two) times daily with a meal. 04/25/16   Carrie Mew, MD  oxyCODONE (ROXICODONE) 5 MG immediate  release tablet Take 1 tablet (5 mg total) by mouth every 4 (four) hours as needed. 04/23/16 04/23/17  Duanne Guess, PA-C  predniSONE (DELTASONE) 20 MG tablet Take 2 tablets (40 mg total) by mouth daily. 04/25/16   Carrie Mew, MD  tadalafil (CIALIS) 5 MG tablet 1 tab daily 04/15/11   Midge Minium, MD  Testosterone 1.25 GM/ACT (1%) GEL Place onto the skin 2 (two) times daily.      Historical Provider, MD  venlafaxine XR (EFFEXOR-XR) 150 MG 24 hr capsule TAKE ONE CAPSULE BY MOUTH EVERY DAY 09/05/13   Midge Minium, MD   [place] CSRS:   04/19/2016 04/19/2016 TRAMADOL HCL 50 MG TABLET [id number] 40 13 0 0 [id number] Merrilee Seashore MD [place], [place] [id number] [place] [facility] [place], [place] Plouffe, Melissa L [birth year] [address] [place], [place] [address] 03 15.38 04/11/2016 04/11/2016 LYRICA 100 MG CAPSULE [id number] 60 30 0 2 [id number] [place], [place] [id number] [place] [facility] [place], [place], Fiore L 10-27-1960 [address] [place], [place] [address] 03 0 03/11/2016 03/08/2016 LYRICA 100 MG CAPSULE [id number] 60 30 0 0 [id number] [place], [place] [id number] [place] [facility] [place], [place] Ell, Eino L [birth year] [address] [place], [place] [address] 03 0 01/28/2016 01/26/2016 LYRICA 75 MG CAPSULE [id number] 60 30 0 3 [id number] [place], [place] [id number] [place] [facility] [place], [place] Diana, Teagen L 10/21/1960 [address] [place], [place] [id number] 03 0 01/14/2015 01/14/2015 TRAMADOL HCL 50 MG TABLET [id number] 15 3 0 0 [id number] [place], [place] [id number]  [facility] [place], [place] Edison, Tion L 03-04-1960 [address] [place], [place] [id number]    Allergies Patient has no known allergies.   Family History  Problem Relation Age of Onset  . Diabetes Mother     Social History Social History  Substance Use Topics  . Smoking status: Current Every Day Smoker    Packs/day: 1.00    Types: Cigarettes  . Smokeless tobacco: Never Used  . Alcohol use No    Review of Systems  Constitutional:   No fever or chills.  ENT:   No sore throat. No rhinorrhea. Cardiovascular:   No chest pain. Respiratory:   No dyspnea or cough. Gastrointestinal:   Negative for abdominal pain, vomiting and diarrhea.  Genitourinary:   Negative for dysuria or  difficulty urinating. Musculoskeletal:   Low back pain as above Neurological:   Negative for headaches or weakness. Left inner thigh paresthesia. 10-point ROS otherwise negative.  ____________________________________________   PHYSICAL EXAM:  VITAL SIGNS: ED Triage Vitals  Enc Vitals Group     BP 04/25/16 1037 (!) 144/74     Pulse Rate 04/25/16 1037 75     Resp 04/25/16 1037 16     Temp 04/25/16 1037 98 F (36.7 C)     Temp Source 04/25/16 1037 Oral     SpO2 04/25/16 1037 100 %     Weight 04/25/16 1036 175 lb (79.4 kg)     Height 04/25/16 1036 5\' 10"  (1.778 m)     Head Circumference --      Peak Flow --      Pain Score 04/25/16 1036 9     Pain Loc --      Pain Edu? --      Excl. in [place]? --  Vital signs reviewed, nursing assessments reviewed.   Constitutional:   Alert and oriented. Well appearing and in no distress. Eyes:   No scleral icterus. No conjunctival pallor. PERRL. EOMI.  No nystagmus. ENT   Head:   Normocephalic and atraumatic.   Nose:   No congestion/rhinnorhea. No septal hematoma   Mouth/Throat:   MMM, no pharyngeal erythema. No peritonsillar mass.    Neck:   No stridor. No SubQ emphysema. No meningismus. Hematological/Lymphatic/Immunilogical:   No cervical lymphadenopathy. Cardiovascular:   RRR. Symmetric bilateral radial and DP pulses.  No murmurs.  Respiratory:   Normal respiratory effort without tachypnea nor retractions. Breath sounds are clear and equal bilaterally. No wheezes/rales/rhonchi. Gastrointestinal:   Soft and nontender. Non distended. There is no CVA tenderness.  No rebound, rigidity, or guarding. Genitourinary:   deferred Musculoskeletal:   Normal range of motion in all extremities. No joint effusions.  No lower extremity tenderness.  No edema. Neurologic:   Normal speech and language.  CN 2-10 normal. Motor grossly intact.EHL positive.  Normal gait and balance. Normal coordination. Straight leg raise negative on the right,  positive it 45 on the left. No gross focal neurologic deficits are appreciated.  Skin:    Skin is warm, dry and intact. No rash noted.  No petechiae, purpura, or bullae.  ____________________________________________    LABS (pertinent positives/negatives) (all labs ordered are listed, but only abnormal results are displayed) Labs Reviewed - No data to display ____________________________________________   EKG    ____________________________________________    RADIOLOGY  Mr Lumbar Spine Wo Contrast  Result Date: 04/25/2016 CLINICAL DATA:  Low back pain for 2 weeks radiating into the left leg. No known injury. EXAM: MRI LUMBAR SPINE WITHOUT CONTRAST TECHNIQUE: Multiplanar, multisequence MR imaging of the lumbar spine was performed. No intravenous contrast was administered. COMPARISON:  None. FINDINGS: Segmentation:  Standard. Alignment:  Maintained. Vertebrae: No fracture or worrisome lesion. A few small Schmorl's nodes in the upper lumbar spine are noted. Conus medullaris: Extends to the L1-2 level and appears normal. Paraspinal and other soft tissues: Negative. Disc levels: T12-L1:  Negative. L1-2:  Negative. L2-3: Extraforaminal protrusion on the left contacts the exited left L2 root. The central canal and right foramen are widely patent. L3-4: Shallow disc bulge causes mild central canal narrowing. The foramina are widely patent. L4-5: Shallow disc bulge and mild-to-moderate facet degenerative disease. There is mild narrowing in the subarticular recesses without nerve root compression. The foramina are open. L5-S1: Shallow broad-based central protrusion without central canal or foraminal narrowing. IMPRESSION: Extraforaminal protrusion on the left at L2-3 contacts the exited left L2 root. Mild central canal narrowing without nerve root compression at L3-4 where there is a shallow disc bulge. Mild bilateral subarticular recess narrowing L4-5 due to a disc bulge without nerve root  compression. Electronically Signed   By: Inge Rise M.D.   On: 04/25/2016 13:06    ____________________________________________   PROCEDURES Procedures  ____________________________________________   INITIAL IMPRESSION / ASSESSMENT AND PLAN / ED COURSE  Pertinent labs & imaging results that were available during my care of the patient were reviewed by me and considered in my medical decision making (see chart for details).  Patient presents with progressive symptoms of lower back pain with radiation into the leg and some paresthesia. I'm unable to reproduce his symptoms on exam, but with his chronic lumbar spine issues including evidence of bulging disks in the L3-L4 and L4-L5 region on MRI in 2015 and progressive symptoms, we'll repeat MRI  today especially since this is his second ED visit for this episode of acute pain.     ----------------------------------------- 2:29 PM on 04/25/2016 -----------------------------------------  MRI negative except for L2-L3 nerve root impingement. Have the patient do steroids and anti-inflammatories with H2 blocker for gastric protection, follow-up with neurosurgery given his symptoms with radiculopathy.      ____________________________________________   FINAL CLINICAL IMPRESSION(S) / ED DIAGNOSES  Final diagnoses:  Low back pain radiating to left leg  Paresthesia and pain of left extremity  Lumbar radiculopathy, chronic      New Prescriptions   FAMOTIDINE (PEPCID) 20 MG TABLET    Take 1 tablet (20 mg total) by mouth 2 (two) times daily.   NAPROXEN (NAPROSYN) 500 MG TABLET    Take 1 tablet (500 mg total) by mouth 2 (two) times daily with a meal.   PREDNISONE (DELTASONE) 20 MG TABLET    Take 2 tablets (40 mg total) by mouth daily.     Portions of this note were generated with dragon dictation software. Dictation errors may occur despite best attempts at proofreading.    Carrie Mew, MD 04/25/16 1430

## 2016-04-27 DIAGNOSIS — M5416 Radiculopathy, lumbar region: Secondary | ICD-10-CM | POA: Diagnosis not present

## 2016-04-29 DIAGNOSIS — M797 Fibromyalgia: Secondary | ICD-10-CM | POA: Diagnosis not present

## 2016-04-29 DIAGNOSIS — M5416 Radiculopathy, lumbar region: Secondary | ICD-10-CM | POA: Diagnosis not present

## 2016-04-29 DIAGNOSIS — M1990 Unspecified osteoarthritis, unspecified site: Secondary | ICD-10-CM | POA: Diagnosis not present

## 2016-04-29 DIAGNOSIS — Z79891 Long term (current) use of opiate analgesic: Secondary | ICD-10-CM | POA: Diagnosis not present

## 2016-05-03 DIAGNOSIS — M5416 Radiculopathy, lumbar region: Secondary | ICD-10-CM | POA: Diagnosis not present

## 2016-05-09 ENCOUNTER — Encounter
Admission: RE | Admit: 2016-05-09 | Discharge: 2016-05-09 | Disposition: A | Discharge status: Home / Self Care | Payer: Medicare HMO | Source: Ambulatory Visit | Attending: Neurosurgery | Admitting: Neurosurgery

## 2016-05-09 ENCOUNTER — Ambulatory Visit
Admission: RE | Admit: 2016-05-09 | Discharge: 2016-05-09 | Disposition: A | Discharge status: Home / Self Care | Payer: Medicare HMO | Source: Ambulatory Visit | Attending: Neurosurgery | Admitting: Neurosurgery

## 2016-05-09 DIAGNOSIS — Z01818 Encounter for other preprocedural examination: Secondary | ICD-10-CM | POA: Diagnosis not present

## 2016-05-09 DIAGNOSIS — Z720 Tobacco use: Secondary | ICD-10-CM | POA: Diagnosis not present

## 2016-05-09 DIAGNOSIS — E785 Hyperlipidemia, unspecified: Secondary | ICD-10-CM | POA: Diagnosis not present

## 2016-05-09 DIAGNOSIS — Z01812 Encounter for preprocedural laboratory examination: Secondary | ICD-10-CM | POA: Insufficient documentation

## 2016-05-09 HISTORY — DX: Gastro-esophageal reflux disease without esophagitis: K21.9

## 2016-05-09 HISTORY — DX: Fibromyalgia: M79.7

## 2016-05-09 LAB — CBC
HEMATOCRIT: 44.9 % (ref 40.0–52.0)
HEMOGLOBIN: 15.2 g/dL (ref 13.0–18.0)
MCH: 28.8 pg (ref 26.0–34.0)
MCHC: 33.9 g/dL (ref 32.0–36.0)
MCV: 84.9 fL (ref 80.0–100.0)
Platelets: 183 10*3/uL (ref 150–440)
RBC: 5.28 MIL/uL (ref 4.40–5.90)
RDW: 14 % (ref 11.5–14.5)
WBC: 15.6 10*3/uL — AB (ref 3.8–10.6)

## 2016-05-09 LAB — BASIC METABOLIC PANEL
ANION GAP: 5 (ref 5–15)
BUN: 21 mg/dL — ABNORMAL HIGH (ref 6–20)
CALCIUM: 8.4 mg/dL — AB (ref 8.9–10.3)
CO2: 30 mmol/L (ref 22–32)
Chloride: 96 mmol/L — ABNORMAL LOW (ref 101–111)
Creatinine, Ser: 1.12 mg/dL (ref 0.61–1.24)
GFR calc non Af Amer: >60 mL/min (ref >60)
GLUCOSE: 105 mg/dL — AB (ref 65–99)
POTASSIUM: 4.3 mmol/L (ref 3.5–5.1)
Sodium: 131 mmol/L — ABNORMAL LOW (ref 135–145)

## 2016-05-09 LAB — DIFFERENTIAL
BASOS ABS: 0.1 10*3/uL (ref 0–0.1)
Basophils Relative: 1 %
Eosinophils Absolute: 0.4 10*3/uL (ref 0–0.7)
Eosinophils Relative: 2 %
LYMPHS ABS: 1.8 10*3/uL (ref 1.0–3.6)
LYMPHS PCT: 12 %
MONOS PCT: 7 %
Monocytes Absolute: 1.1 10*3/uL — ABNORMAL HIGH (ref 0.2–1.0)
NEUTROS ABS: 12.3 10*3/uL — AB (ref 1.4–6.5)
NEUTROS PCT: 78 %

## 2016-05-09 LAB — URINALYSIS, COMPLETE (UACMP) WITH MICROSCOPIC
Bacteria, UA: NONE SEEN
Bilirubin Urine: NEGATIVE
GLUCOSE, UA: NEGATIVE mg/dL
HGB URINE DIPSTICK: NEGATIVE
Ketones, ur: NEGATIVE mg/dL
LEUKOCYTES UA: NEGATIVE
Nitrite: NEGATIVE
PH: 7 (ref 5.0–8.0)
PROTEIN: 30 mg/dL — AB
SPECIFIC GRAVITY, URINE: 1.019 (ref 1.005–1.030)
Squamous Epithelial / LPF: NONE SEEN

## 2016-05-09 LAB — SURGICAL PCR SCREEN
MRSA, PCR: NEGATIVE
STAPHYLOCOCCUS AUREUS: NEGATIVE

## 2016-05-09 LAB — PROTIME-INR
INR: 0.92
Prothrombin Time: 12.4 seconds (ref 11.4–15.2)

## 2016-05-09 LAB — APTT: APTT: 29 s (ref 24–36)

## 2016-05-09 MED ORDER — CEFAZOLIN SODIUM-DEXTROSE 2-4 GM/100ML-% IV SOLN
2.0000 g | INTRAVENOUS | Status: DC
  Filled 2016-05-09: qty 100

## 2016-05-09 NOTE — Patient Instructions (Signed)
  Your procedure is scheduled on: May 11, 2016 (Wednesday)  Report to Same Day Surgery 2nd floor medical mall [facility] Entrance-take elevator on left to 2nd floor.  Check in with surgery information desk.) To find out your arrival time please call [phone] between 1PM - 3PM on May 10, 2016  (Tuesday)  Remember: Instructions that are not followed completely may result in serious medical risk, up to and including death, or upon the discretion of your surgeon and anesthesiologist your surgery may need to be rescheduled.    _x___ 1. Do not eat food or drink liquids after midnight. No gum chewing or hard candies.                                 __x__ 2. No Alcohol for 24 hours before or after surgery.   __x__3. No Smoking for 24 prior to surgery.   ____  4. Bring all medications with you on the day of surgery if instructed.    __x__ 5. Notify your doctor if there is any change in your medical condition     (cold, fever, infections).     Do not wear jewelry, make-up, hairpins, clips or nail polish.  Do not wear lotions, powders, or perfumes. You may wear deodorant.  Do not shave 48 hours prior to surgery. Men may shave face and neck.  Do not bring valuables to the hospital.    [facility] is not responsible for any belongings or valuables.               Contacts, dentures or bridgework may not be worn into surgery.  Leave your suitcase in the car. After surgery it may be brought to your room.  For patients admitted to the hospital, discharge time is determined by your                       treatment team.   Patients discharged the day of surgery will not be allowed to drive home.  You will need someone to drive you home and stay with you the night of your procedure.    Please read over the following fact sheets that you were given:   [facility] Preparing for Surgery and or MRSA Information   _x___ Take anti-hypertensive (unless it includes a diuretic), cardiac, seizure,  asthma,     anti-reflux and psychiatric medicines. These include:  1. Famotidine  2. Levothyroxine  3. Lyrica  4.  5.  6.  ____Fleets enema or Magnesium Citrate as directed.   _x___ Use CHG Soap or sage wipes as directed on instruction sheet   ____ Use inhalers on the day of surgery and bring to hospital day of surgery  ____ Stop Metformin and Janumet 2 days prior to surgery.    ____ Take 1/2 of usual insulin dose the night before surgery and none on the morning surgery       _x___ Follow recommendations from Cardiologist, Pulmonologist or PCP regarding          stopping Aspirin, Coumadin, Pllavix ,Eliquis, Effient, or Pradaxa, and Pletal.  X____Stop Anti-inflammatories such as Advil, Aleve, Ibuprofen, Motrin, Naproxen, Naprosyn, Goodies powders or aspirin products. OK to take Tylenol .   _x___ Stop supplements until after surgery.  But may continue Vitamin D, Vitamin B, and multivitamin.        ____ Bring C-Pap to the hospital.

## 2016-05-09 NOTE — Progress Notes (Signed)
Pharmacy consult: Weight based Pre-op dose of Cefazolin  Wt=79.4 kg  Will order Cefazolin 2 gram IV x 1 pre-op  (as per General Surgery pre-op order set for patient < 100 kg)   Chinita Greenland PharmD Clinical Pharmacist 05/09/2016

## 2016-05-09 NOTE — Pre-Procedure Instructions (Signed)
EKG DISCUSSED WITH DR Ronelle Nigh. OK UNLESS LABS SHOW ANEMIA. IF SO WILL NEED TO BE ADDRESSED.

## 2016-05-10 NOTE — Pre-Procedure Instructions (Addendum)
[place] DR Walcott OK WITH WBC AND PROTEIN IN IN URINE. SPOKE WITH DR Amie Critchley AND OK WITH LABS

## 2016-05-11 ENCOUNTER — Ambulatory Visit: Payer: Medicare HMO

## 2016-05-11 ENCOUNTER — Encounter: Payer: Self-pay | Admitting: *Deleted

## 2016-05-11 ENCOUNTER — Encounter
Admission: RE | Disposition: A | Discharge status: Home / Self Care | Payer: Self-pay | Source: Ambulatory Visit | Attending: Neurosurgery

## 2016-05-11 ENCOUNTER — Ambulatory Visit
Admission: RE | Admit: 2016-05-11 | Discharge: 2016-05-11 | Disposition: A | Discharge status: Home / Self Care | Payer: Medicare HMO | Source: Ambulatory Visit | Attending: Neurosurgery | Admitting: Neurosurgery

## 2016-05-11 ENCOUNTER — Ambulatory Visit: Payer: Medicare HMO | Admitting: Anesthesiology

## 2016-05-11 DIAGNOSIS — M5416 Radiculopathy, lumbar region: Secondary | ICD-10-CM | POA: Diagnosis not present

## 2016-05-11 DIAGNOSIS — J449 Chronic obstructive pulmonary disease, unspecified: Secondary | ICD-10-CM | POA: Insufficient documentation

## 2016-05-11 DIAGNOSIS — K219 Gastro-esophageal reflux disease without esophagitis: Secondary | ICD-10-CM | POA: Insufficient documentation

## 2016-05-11 DIAGNOSIS — M5126 Other intervertebral disc displacement, lumbar region: Secondary | ICD-10-CM | POA: Diagnosis not present

## 2016-05-11 DIAGNOSIS — E039 Hypothyroidism, unspecified: Secondary | ICD-10-CM | POA: Insufficient documentation

## 2016-05-11 DIAGNOSIS — M5116 Intervertebral disc disorders with radiculopathy, lumbar region: Secondary | ICD-10-CM | POA: Insufficient documentation

## 2016-05-11 DIAGNOSIS — F419 Anxiety disorder, unspecified: Secondary | ICD-10-CM | POA: Diagnosis not present

## 2016-05-11 DIAGNOSIS — Z0389 Encounter for observation for other suspected diseases and conditions ruled out: Secondary | ICD-10-CM | POA: Diagnosis not present

## 2016-05-11 DIAGNOSIS — M797 Fibromyalgia: Secondary | ICD-10-CM | POA: Diagnosis not present

## 2016-05-11 DIAGNOSIS — F418 Other specified anxiety disorders: Secondary | ICD-10-CM | POA: Diagnosis not present

## 2016-05-11 DIAGNOSIS — F17200 Nicotine dependence, unspecified, uncomplicated: Secondary | ICD-10-CM | POA: Diagnosis not present

## 2016-05-11 DIAGNOSIS — Z419 Encounter for procedure for purposes other than remedying health state, unspecified: Secondary | ICD-10-CM

## 2016-05-11 DIAGNOSIS — Z79899 Other long term (current) drug therapy: Secondary | ICD-10-CM | POA: Diagnosis not present

## 2016-05-11 HISTORY — PX: LUMBAR LAMINECTOMY/DECOMPRESSION MICRODISCECTOMY: SHX5026

## 2016-05-11 SURGERY — LUMBAR LAMINECTOMY/DECOMPRESSION MICRODISCECTOMY 1 LEVEL
Anesthesia: General | Site: Spine Lumbar | Laterality: Left | Wound class: Clean

## 2016-05-11 MED ORDER — BUPIVACAINE-EPINEPHRINE (PF) 0.5% -1:200000 IJ SOLN
INTRAMUSCULAR | Status: DC | PRN
  Administered 2016-05-11: 10 mL

## 2016-05-11 MED ORDER — PROPOFOL 10 MG/ML IV BOLUS
INTRAVENOUS | Status: DC | PRN
Start: 2016-05-11 — End: 2016-05-11
  Administered 2016-05-11: 150 mg via INTRAVENOUS

## 2016-05-11 MED ORDER — GELATIN ABSORBABLE 12-7 MM EX MISC
CUTANEOUS | Status: AC
  Filled 2016-05-11: qty 1

## 2016-05-11 MED ORDER — SODIUM CHLORIDE 0.9 % IJ SOLN
INTRAMUSCULAR | Status: AC
  Filled 2016-05-11: qty 10

## 2016-05-11 MED ORDER — BUPIVACAINE LIPOSOME 1.3 % IJ SUSP
INTRAMUSCULAR | Status: AC
  Filled 2016-05-11: qty 20

## 2016-05-11 MED ORDER — BUPIVACAINE-EPINEPHRINE (PF) 0.5% -1:200000 IJ SOLN
INTRAMUSCULAR | Status: AC
  Filled 2016-05-11: qty 30

## 2016-05-11 MED ORDER — KETOROLAC TROMETHAMINE 30 MG/ML IJ SOLN
INTRAMUSCULAR | Status: AC
  Filled 2016-05-11: qty 1

## 2016-05-11 MED ORDER — FENTANYL CITRATE (PF) 100 MCG/2ML IJ SOLN
INTRAMUSCULAR | Status: DC | PRN
  Administered 2016-05-11: 50 ug via INTRAVENOUS
  Administered 2016-05-11 (×2): 100 ug via INTRAVENOUS

## 2016-05-11 MED ORDER — DEXAMETHASONE SODIUM PHOSPHATE 10 MG/ML IJ SOLN
INTRAMUSCULAR | Status: DC | PRN
  Administered 2016-05-11: 10 mg via INTRAVENOUS

## 2016-05-11 MED ORDER — FENTANYL CITRATE (PF) 250 MCG/5ML IJ SOLN
INTRAMUSCULAR | Status: AC
  Filled 2016-05-11: qty 5

## 2016-05-11 MED ORDER — CYCLOBENZAPRINE HCL 10 MG PO TABS: 10.0000 mg | ORAL_TABLET | Freq: Three times a day (TID) | ORAL | 0 refills | Status: DC | PRN

## 2016-05-11 MED ORDER — FENTANYL CITRATE (PF) 100 MCG/2ML IJ SOLN
INTRAMUSCULAR | Status: AC
  Filled 2016-05-11: qty 2

## 2016-05-11 MED ORDER — ROCURONIUM BROMIDE 100 MG/10ML IV SOLN
INTRAVENOUS | Status: DC | PRN
  Administered 2016-05-11: 10 mg via INTRAVENOUS
  Administered 2016-05-11: 40 mg via INTRAVENOUS
  Administered 2016-05-11: 20 mg via INTRAVENOUS

## 2016-05-11 MED ORDER — LACTATED RINGERS IV SOLN
INTRAVENOUS | Status: DC
  Administered 2016-05-11: 06:00:00 via INTRAVENOUS

## 2016-05-11 MED ORDER — ONDANSETRON HCL 4 MG/2ML IJ SOLN
INTRAMUSCULAR | Status: DC | PRN
Start: 2016-05-11 — End: 2016-05-11
  Administered 2016-05-11: 4 mg via INTRAVENOUS

## 2016-05-11 MED ORDER — HYDROMORPHONE HCL 1 MG/ML IJ SOLN
INTRAMUSCULAR | Status: AC
  Filled 2016-05-11: qty 1

## 2016-05-11 MED ORDER — DEXAMETHASONE SODIUM PHOSPHATE 10 MG/ML IJ SOLN
INTRAMUSCULAR | Status: AC
  Filled 2016-05-11: qty 1

## 2016-05-11 MED ORDER — OXYCODONE-ACETAMINOPHEN 5-325 MG PO TABS: 1.0000 | ORAL_TABLET | ORAL | 0 refills | Status: AC | PRN

## 2016-05-11 MED ORDER — ACETAMINOPHEN 10 MG/ML IV SOLN
INTRAVENOUS | Status: DC | PRN
  Administered 2016-05-11: 1000 mg via INTRAVENOUS

## 2016-05-11 MED ORDER — SUGAMMADEX SODIUM 200 MG/2ML IV SOLN
INTRAVENOUS | Status: AC
  Filled 2016-05-11: qty 2

## 2016-05-11 MED ORDER — ONDANSETRON HCL 4 MG/2ML IJ SOLN: 4.0000 mg | Freq: Once | INTRAMUSCULAR | Status: DC | PRN

## 2016-05-11 MED ORDER — SUGAMMADEX SODIUM 200 MG/2ML IV SOLN
INTRAVENOUS | Status: DC | PRN
  Administered 2016-05-11: 110 mg via INTRAVENOUS
  Administered 2016-05-11: 200 mg via INTRAVENOUS

## 2016-05-11 MED ORDER — PROPOFOL 10 MG/ML IV BOLUS
INTRAVENOUS | Status: AC
  Filled 2016-05-11: qty 20

## 2016-05-11 MED ORDER — OXYCODONE-ACETAMINOPHEN 5-325 MG PO TABS
ORAL_TABLET | ORAL | Status: AC
  Filled 2016-05-11: qty 1

## 2016-05-11 MED ORDER — CEFAZOLIN SODIUM-DEXTROSE 2-3 GM-% IV SOLR
2.0000 g | INTRAVENOUS | Status: AC
  Administered 2016-05-11: 2 g via INTRAVENOUS
  Filled 2016-05-11: qty 50

## 2016-05-11 MED ORDER — METHYLPREDNISOLONE ACETATE 40 MG/ML IJ SUSP
INTRAMUSCULAR | Status: DC | PRN
  Administered 2016-05-11: 40 mg

## 2016-05-11 MED ORDER — LIDOCAINE HCL (PF) 2 % IJ SOLN
INTRAMUSCULAR | Status: AC
  Filled 2016-05-11: qty 2

## 2016-05-11 MED ORDER — MIDAZOLAM HCL 2 MG/2ML IJ SOLN
INTRAMUSCULAR | Status: AC
  Filled 2016-05-11: qty 2

## 2016-05-11 MED ORDER — THROMBIN 5000 UNITS EX SOLR
CUTANEOUS | Status: AC
  Filled 2016-05-11: qty 10000

## 2016-05-11 MED ORDER — BACITRACIN 50000 UNITS IM SOLR
INTRAMUSCULAR | Status: AC
  Filled 2016-05-11: qty 1

## 2016-05-11 MED ORDER — ROCURONIUM BROMIDE 50 MG/5ML IV SOLN
INTRAVENOUS | Status: AC
  Filled 2016-05-11: qty 1

## 2016-05-11 MED ORDER — THROMBIN 5000 UNITS EX SOLR
CUTANEOUS | Status: DC | PRN
  Administered 2016-05-11: 5000 [IU] via TOPICAL

## 2016-05-11 MED ORDER — BACITRACIN 50000 UNITS IM SOLR
INTRAMUSCULAR | Status: DC | PRN
  Administered 2016-05-11: 5000 [IU]

## 2016-05-11 MED ORDER — KETOROLAC TROMETHAMINE 30 MG/ML IJ SOLN
INTRAMUSCULAR | Status: DC | PRN
  Administered 2016-05-11: 30 mg via INTRAVENOUS

## 2016-05-11 MED ORDER — ACETAMINOPHEN 10 MG/ML IV SOLN
INTRAVENOUS | Status: AC
  Filled 2016-05-11: qty 100

## 2016-05-11 MED ORDER — BUPIVACAINE LIPOSOME 1.3 % IJ SUSP
INTRAMUSCULAR | Status: DC | PRN
  Administered 2016-05-11: 20 mL

## 2016-05-11 MED ORDER — CEFAZOLIN SODIUM-DEXTROSE 2-4 GM/100ML-% IV SOLN
INTRAVENOUS | Status: AC
  Filled 2016-05-11: qty 100

## 2016-05-11 MED ORDER — LIDOCAINE HCL (CARDIAC) 20 MG/ML IV SOLN
INTRAVENOUS | Status: DC | PRN
  Administered 2016-05-11: 50 mg via INTRAVENOUS

## 2016-05-11 MED ORDER — GELATIN ABSORBABLE 12-7 MM EX MISC
CUTANEOUS | Status: DC | PRN
  Administered 2016-05-11: 1

## 2016-05-11 MED ORDER — MIDAZOLAM HCL 2 MG/2ML IJ SOLN
INTRAMUSCULAR | Status: DC | PRN
  Administered 2016-05-11: 2 mg via INTRAVENOUS

## 2016-05-11 MED ORDER — METHYLPREDNISOLONE ACETATE 40 MG/ML IJ SUSP
INTRAMUSCULAR | Status: AC
  Filled 2016-05-11: qty 1

## 2016-05-11 MED ORDER — FENTANYL CITRATE (PF) 100 MCG/2ML IJ SOLN
25.0000 ug | INTRAMUSCULAR | Status: DC | PRN
  Administered 2016-05-11 (×4): 25 ug via INTRAVENOUS

## 2016-05-11 MED ORDER — ONDANSETRON HCL 4 MG/2ML IJ SOLN
INTRAMUSCULAR | Status: AC
  Filled 2016-05-11: qty 2

## 2016-05-11 MED ORDER — OXYCODONE-ACETAMINOPHEN 5-325 MG PO TABS
1.0000 | ORAL_TABLET | ORAL | Status: DC | PRN
  Administered 2016-05-11: 1 via ORAL

## 2016-05-11 SURGICAL SUPPLY — 63 items
BAND RUBBER 3X1/6 TAN STRL (MISCELLANEOUS) ×6 IMPLANT
BLADE BOVIE TIP EXT 4 (BLADE) ×3 IMPLANT
BUR NEURO DRILL SOFT 3.0X3.8M (BURR) ×3 IMPLANT
CANISTER SUCT 1200ML W/VALVE (MISCELLANEOUS) ×3 IMPLANT
CHLORAPREP W/TINT 26ML (MISCELLANEOUS) ×6 IMPLANT
CNTNR SPEC 2.5X3XGRAD LEK (MISCELLANEOUS) ×1
CONT SPEC 4OZ STER OR WHT (MISCELLANEOUS) ×2
CONTAINER SPEC 2.5X3XGRAD LEK (MISCELLANEOUS) ×1 IMPLANT
COUNTER NEEDLE 20/40 LG (NEEDLE) ×3 IMPLANT
COVER LIGHT HANDLE STERIS (MISCELLANEOUS) ×6 IMPLANT
CUP MEDICINE 2OZ PLAST GRAD ST (MISCELLANEOUS) ×6 IMPLANT
DERMABOND ADVANCED (GAUZE/BANDAGES/DRESSINGS) ×2
DERMABOND ADVANCED .7 DNX12 (GAUZE/BANDAGES/DRESSINGS) ×1 IMPLANT
DRAPE C-ARM 42X72 X-RAY (DRAPES) ×6 IMPLANT
DRAPE LAPAROTOMY 100X77 ABD (DRAPES) ×3 IMPLANT
DRAPE MICROSCOPE SPINE 48X150 (DRAPES) ×2 IMPLANT
DRAPE POUCH INSTRU U-SHP 10X18 (DRAPES) ×3 IMPLANT
DRAPE SURG 17X11 SM STRL (DRAPES) ×12 IMPLANT
DRSG TEGADERM 4X4.75 (GAUZE/BANDAGES/DRESSINGS) IMPLANT
DRSG TELFA 4X3 1S NADH ST (GAUZE/BANDAGES/DRESSINGS) IMPLANT
DURASEAL APPLICATOR TIP (TIP) IMPLANT
DURASEAL SPINE SEALANT 3ML (MISCELLANEOUS) IMPLANT
ELECT CAUTERY BLADE TIP 2.5 (TIP) ×3
ELECT EZSTD 165MM 6.5IN (MISCELLANEOUS)
ELECTRODE CAUTERY BLDE TIP 2.5 (TIP) ×1 IMPLANT
ELECTRODE EZSTD 165MM 6.5IN (MISCELLANEOUS) IMPLANT
FRAME EYE SHIELD (PROTECTIVE WEAR) ×3 IMPLANT
GLOVE SURG SYN 8.5  E (GLOVE) ×6
GLOVE SURG SYN 8.5 E (GLOVE) ×3 IMPLANT
GOWN STRL REUS W/ TWL LRG LVL3 (GOWN DISPOSABLE) ×1 IMPLANT
GOWN STRL REUS W/ TWL XL LVL3 (GOWN DISPOSABLE) ×1 IMPLANT
GOWN STRL REUS W/TWL LRG LVL3 (GOWN DISPOSABLE) ×2
GOWN STRL REUS W/TWL XL LVL3 (GOWN DISPOSABLE) ×2
GRADUATE 1200CC STRL 31836 (MISCELLANEOUS) ×3 IMPLANT
KIT SPINAL PRONEVIEW (KITS) ×3 IMPLANT
KNIFE BAYONET SHORT DISCETOMY (MISCELLANEOUS) IMPLANT
MARKER SKIN DUAL TIP RULER LAB (MISCELLANEOUS) ×6 IMPLANT
NDL SAFETY ECLIPSE 18X1.5 (NEEDLE) ×1 IMPLANT
NEEDLE HYPO 18GX1.5 SHARP (NEEDLE) ×2
NEEDLE HYPO 22GX1.5 SAFETY (NEEDLE) ×3 IMPLANT
NS IRRIG 1000ML POUR BTL (IV SOLUTION) ×3 IMPLANT
PACK LAMINECTOMY NEURO (CUSTOM PROCEDURE TRAY) ×3 IMPLANT
PAD ARMBOARD 7.5X6 YLW CONV (MISCELLANEOUS) ×3 IMPLANT
PATTIES SURGICAL .5X1.5 (GAUZE/BANDAGES/DRESSINGS) IMPLANT
SPOGE SURGIFLO 8M (HEMOSTASIS)
SPONGE SURGIFLO 8M (HEMOSTASIS) IMPLANT
STAPLER SKIN PROX 35W (STAPLE) IMPLANT
SURGIFLO W/THROMBIN 8M KIT (HEMOSTASIS) ×2 IMPLANT
SUT DVC VLOC 3-0 CL 6 P-12 (SUTURE) ×6 IMPLANT
SUT NURALON 4 0 TR CR/8 (SUTURE) IMPLANT
SUT VIC AB 0 CT1 27 (SUTURE) ×6
SUT VIC AB 0 CT1 27XCR 8 STRN (SUTURE) ×3 IMPLANT
SUT VIC AB 2-0 CT1 18 (SUTURE) ×3 IMPLANT
SUT VIC AB 3-0 SH 27 (SUTURE) ×2
SUT VIC AB 3-0 SH 27X BRD (SUTURE) ×1 IMPLANT
SUT VICRYL 0 UR6 27IN ABS (SUTURE) ×3 IMPLANT
SYR 20CC LL (SYRINGE) ×3 IMPLANT
SYRINGE 10CC LL (SYRINGE) ×3 IMPLANT
TOWEL OR 17X26 4PK STRL BLUE (TOWEL DISPOSABLE) ×6 IMPLANT
TUBE MATRX SPINL 22MM 5CM DISP (INSTRUMENTS) ×1
TUBE METRX SPINAL 22X5 DISP (INSTRUMENTS) ×1 IMPLANT
TUBING CONNECTING 10 (TUBING) ×2 IMPLANT
TUBING CONNECTING 10' (TUBING) ×1

## 2016-05-11 NOTE — Transfer of Care (Signed)
Immediate Anesthesia Transfer of Care Note  Patient: Dillon Singh  Procedure(s) Performed: Procedure(s): LUMBAR LAMINECTOMY/DECOMPRESSION MICRODISCECTOMY L2-3 (Left)  Patient Location: PACU  Anesthesia Type:General  Level of Consciousness: sedated and responds to stimulation  Airway & Oxygen Therapy: Patient Spontanous Breathing and Patient connected to face mask oxygen  Post-op Assessment: Report given to RN and Post -op Vital signs reviewed and stable  Post vital signs: Reviewed and stable  Last Vitals:  Vitals:   05/11/16 0612 05/11/16 0906  BP: 117/71 115/69  Pulse: (!) 108 70  Resp: (!) 22 12  Temp: 36.5 C 36.7 C    Last Pain:  Vitals:   05/11/16 0906  TempSrc:   PainSc: Asleep         Complications: No apparent anesthesia complications

## 2016-05-11 NOTE — Anesthesia Procedure Notes (Signed)
Procedure Name: Intubation Date/Time: 05/11/2016 7:31 AM Performed by: Jonna Clark Pre-anesthesia Checklist: Patient identified, Patient being monitored, Timeout performed, Emergency Drugs available and Suction available Patient Re-evaluated:Patient Re-evaluated prior to inductionOxygen Delivery Method: Circle system utilized Preoxygenation: Pre-oxygenation with 100% oxygen Intubation Type: IV induction Ventilation: Mask ventilation without difficulty Laryngoscope Size: Mac and 3 Grade View: Grade II Tube type: Oral Tube size: 7.5 mm Number of attempts: 1 Placement Confirmation: ETT inserted through vocal cords under direct vision,  positive ETCO2 and breath sounds checked- equal and bilateral Secured at: 21 cm Tube secured with: Tape Dental Injury: Teeth and Oropharynx as per pre-operative assessment

## 2016-05-11 NOTE — Discharge Instructions (Addendum)
AMBULATORY SURGERY  DISCHARGE INSTRUCTIONS   1) The drugs that you were given will stay in your system until tomorrow so for the next 24 hours you should not:  A) Drive an automobile B) Make any legal decisions C) Drink any alcoholic beverage   2) You may resume regular meals tomorrow.  Today it is better to start with liquids and gradually work up to solid foods.  You may eat anything you prefer, but it is better to start with liquids, then soup and crackers, and gradually work up to solid foods.   3) Please notify your doctor immediately if you have any unusual bleeding, trouble breathing, redness and pain at the surgery site, drainage, fever, or pain not relieved by medication. 4)   5) Your post-operative visit with Dr.                                     is: Date:                        Time:    Please call to schedule your post-operative visit.  6) Additional Instructions:        Your surgeon has performed an operation on your lumbar spine (low back) to relieve pressure on one or more nerves. Many times, patients feel better immediately after surgery and can overdo it. Even if you feel well, it is important that you follow these activity guidelines. If you do not let your back heal properly from the surgery, you can increase the chance of a disc herniation and return of your symptoms. The following are instructions to help in your recovery once you have been discharged from the hospital.  * Do not take anti-inflammatory medications for 5 days after surgery (naproxen [Aleve], ibuprofen [Advil, Motrin], celecoxib [Celebrex], etc.)  Activity    No bending, lifting, or twisting (BLT). Avoid lifting objects heavier than 10 pounds (gallon milk jug).  Where possible, avoid household activities that involve lifting, bending, pushing, or pulling such as laundry, vacuuming, grocery shopping, and childcare. Try to arrange for help from friends and family for these activities  while your back heals.  Increase physical activity slowly as tolerated.  Taking short walks is encouraged, but avoid strenuous exercise. Do not jog, run, bicycle, lift weights, or participate in any other exercises unless specifically allowed by your doctor. Avoid prolonged sitting, including car rides.  Talk to your doctor before resuming sexual activity.  You should not drive until cleared by your doctor.  Until released by your doctor, you should not return to work or school.  You should rest at home and let your body heal.   You may shower three days after your surgery.  After showering, lightly dab your incision dry. Do not take a tub bath or go swimming until approved by your doctor at your follow-up appointment.  If you smoke, we strongly recommend that you quit.  Smoking has been proven to interfere with normal healing in your back and will dramatically reduce the success rate of your surgery. Please contact [facility] (800-QUIT-NOW) and use the resources at www.[facility].com for assistance in stopping smoking.  Surgical Incision   If you have a dressing on your incision, you may remove it two days after your surgery. Keep your incision area clean and dry.  If you have staples or stitches on your incision, you should have a  follow up scheduled for removal. If you do not have staples or stitches, you will have steri-strips (small pieces of surgical tape) or Dermabond glue. The steri-strips/glue should begin to peel away within about a week (it is fine if the steri-strips fall off before then). If the strips are still in place one week after your surgery, you may gently remove them.  Diet            You may return to your usual diet. Be sure to stay hydrated.  When to Contact us  Although your surgery and recovery will likely be uneventful, you may have some residual numbness, aches, and pains in your back and/or legs. This is normal and should improve in the next few  weeks.  However, should you experience any of the following, contact us immediately:  New numbness or weakness  Pain that is progressively getting worse, and is not relieved by your pain medications or rest  Bleeding, redness, swelling, pain, or drainage from surgical incision  Chills or flu-like symptoms  Fever greater than 101.0 F (38.3 C)  Problems with bowel or bladder functions  Difficulty breathing or shortness of breath  Warmth, tenderness, or swelling in your calf  Contact Information  During office hours (Monday-Friday 9 am to 5 pm), please call your physician at [phone]  After hours and weekends, please call the [facility] Operator at [phone] and ask for the Neurosurgery Resident On Call   For a life-threatening emergency, call 911

## 2016-05-11 NOTE — Anesthesia Post-op Follow-up Note (Cosign Needed)
Anesthesia QCDR form completed.        

## 2016-05-11 NOTE — Progress Notes (Signed)
Patient here with red rash to entire back, unsure how Long he has had this.  He thinks it is coming from pain Medication, and advises staff Dr. Is aware.  No itching At this time patient states.   Patient and his daughter Also report patient has been in this horrible pain for Approximately a month now.  Difficult for patient to Rest and to get any comfort or relief.

## 2016-05-11 NOTE — Op Note (Signed)
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 He failed conservative management.  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   Preoperative Note:   Risks of surgery discussed include: infection, bleeding, stroke, coma, death, paralysis, CSF leak, nerve/spinal cord injury, numbness, tingling, weakness, complex regional pain syndrome, recurrent stenosis and/or disc herniation, vascular injury, development of instability, neck/back pain, need for further surgery, persistent symptoms, development of deformity, and the risks of anesthesia. They understood these risks and have agreed to proceed.  Operative Note:    The patient was then brought from the preoperative center with intravenous access established.  The patient underwent general anesthesia and endotracheal tube intubation, and was then rotated on the [place] rail top where all pressure points were appropriately padded.  The skin was then thoroughly cleansed.  Perioperative antibiotic prophylaxis was administered.  Sterile prep and drapes were then applied and a timeout was then observed.  C-arm was brought into the field under sterile conditions, and the L2-3 disc space identified and marked with an incision on the left 2.5 cm lateral to midline.  Once this was complete a 2 cm incision was opened with the use of a #10 blade knife.  The metrics tubes were sequentially advanced under lateral fluoroscopy onto the left L3 transverse process until a 22 x 50 mm Metrix tube was placed over the facet and lamina and secured to the bed.    The microscope was then sterilely brought into the field and muscle creep was hemostased with a bipolar and resected with a pituitary rongeur.  A Bovie extender was then  used to expose the left L3 transverse process and the lateral margin of the L2/3 facet joint. A penfield 4 was used to define the lateral and superior margin of the left L3 pedicle. The L2/3 disc and the intertransverse membrane were identified. The membrane was dissected free of the lateral margin of the left L2 pars and the L3 transverse process.   The disc herniation was identified and dissected free using a balltip probe. The pituitary rongeur was used to remove the extruded disc fragments. Once the nerve root were noted to be relaxed and under less tension the ball-tipped feeler was passed along the foramen to ensure no residual compression was noted.  With none noted, attention was then turned to closure.    A Depo-Medrol soaked Gelfoam pledget was placed along the nerve root for 2 minutes and removed.  The area was irrigated. The tube system was then removed under microscopic visualization and hemostasis was obtained with a bipolar.    The fascial layer was reapproximated with the use of a 0 Vicryl suture.  Subcutaneous tissue layer was reapproximated using 2-0 Vicryl suture.  3-0 monocryl was used on the skin. The skin was then cleansed and Dermabond was used to close the skin opening.  Patient was then rotated back to the preoperative bed awakened from anesthesia and taken to recovery all counts are correct in this case.   I performed the entire surgery.  Meade Maw MD

## 2016-05-11 NOTE — H&P (Signed)
I have reviewed and confirmed my history and physical with no additions or changes. Plan for left L2-3 far lateral discectomy.  Risks and benefits reviewed. Skin inspected - his rash does not extend to the operative site.  Heart sounds normal no MRG. Chest clear to auscultation bilaterally.

## 2016-05-11 NOTE — Anesthesia Postprocedure Evaluation (Signed)
Anesthesia Post Note  Patient: Dillon Singh  Procedure(s) Performed: Procedure(s) (LRB): LUMBAR LAMINECTOMY/DECOMPRESSION MICRODISCECTOMY L2-3 (Left)  Patient location during evaluation: PACU Anesthesia Type: General Level of consciousness: awake Pain management: pain level controlled Vital Signs Assessment: post-procedure vital signs reviewed and stable Respiratory status: spontaneous breathing Anesthetic complications: no     Last Vitals:  Vitals:   05/11/16 0925 05/11/16 0928  BP: 125/83   Pulse: 77 78  Resp: (!) 8 (!) 8  Temp:      Last Pain:  Vitals:   05/11/16 0928  TempSrc:   PainSc: 5                  VAN STAVEREN,Tamora Huneke

## 2016-05-11 NOTE — Progress Notes (Signed)
Patient able to get comfortable lying down, multiple Warm blankets as patient requested and a pillow Under the knees.  Lights turned down low and patient Able to rest easier.  Daughter remains at bedside.

## 2016-05-11 NOTE — Anesthesia Preprocedure Evaluation (Signed)
Anesthesia Evaluation  Patient identified by MRN, date of birth, ID band Patient awake    Reviewed: Allergy & Precautions, NPO status , Patient's Chart, lab work & pertinent test results  Airway Mallampati: II       Dental  (+) Upper Dentures, Lower Dentures   Pulmonary COPD, Current Smoker,     + decreased breath sounds      Cardiovascular Exercise Tolerance: Good  Rhythm:Regular Rate:Normal     Neuro/Psych Anxiety Depression    GI/Hepatic GERD  Medicated,  Endo/Other  Hypothyroidism   Renal/GU      Musculoskeletal  (+) Fibromyalgia -  Abdominal Normal abdominal exam  (+)   Peds negative pediatric ROS (+)  Hematology   Anesthesia Other Findings   Reproductive/Obstetrics                             Anesthesia Physical Anesthesia Plan  ASA: II  Anesthesia Plan: General   Post-op Pain Management:    Induction: Intravenous  Airway Management Planned: Oral ETT  Additional Equipment:   Intra-op Plan:   Post-operative Plan: Extubation in OR  Informed Consent: I have reviewed the patients History and Physical, chart, labs and discussed the procedure including the risks, benefits and alternatives for the proposed anesthesia with the patient or authorized representative who has indicated his/her understanding and acceptance.     Plan Discussed with: CRNA  Anesthesia Plan Comments:         Anesthesia Quick Evaluation

## 2016-05-12 ENCOUNTER — Encounter: Payer: Self-pay | Admitting: Neurosurgery

## 2016-07-04 DIAGNOSIS — E039 Hypothyroidism, unspecified: Secondary | ICD-10-CM | POA: Diagnosis not present

## 2016-07-04 DIAGNOSIS — Z125 Encounter for screening for malignant neoplasm of prostate: Secondary | ICD-10-CM | POA: Diagnosis not present

## 2016-07-04 DIAGNOSIS — E782 Mixed hyperlipidemia: Secondary | ICD-10-CM | POA: Diagnosis not present

## 2016-07-04 DIAGNOSIS — M5136 Other intervertebral disc degeneration, lumbar region: Secondary | ICD-10-CM | POA: Diagnosis not present

## 2016-07-11 DIAGNOSIS — Z0000 Encounter for general adult medical examination without abnormal findings: Secondary | ICD-10-CM | POA: Diagnosis not present

## 2016-07-11 DIAGNOSIS — E039 Hypothyroidism, unspecified: Secondary | ICD-10-CM | POA: Diagnosis not present

## 2016-07-11 DIAGNOSIS — M5136 Other intervertebral disc degeneration, lumbar region: Secondary | ICD-10-CM | POA: Diagnosis not present

## 2016-07-11 DIAGNOSIS — E782 Mixed hyperlipidemia: Secondary | ICD-10-CM | POA: Diagnosis not present

## 2016-07-11 DIAGNOSIS — M797 Fibromyalgia: Secondary | ICD-10-CM | POA: Diagnosis not present

## 2016-07-21 DIAGNOSIS — H524 Presbyopia: Secondary | ICD-10-CM | POA: Diagnosis not present

## 2016-12-02 ENCOUNTER — Emergency Department
Admission: EM | Admit: 2016-12-02 | Discharge: 2016-12-02 | Disposition: A | Discharge status: Home / Self Care | Payer: Medicare HMO | Attending: Emergency Medicine | Admitting: Emergency Medicine

## 2016-12-02 ENCOUNTER — Emergency Department: Payer: Medicare HMO

## 2016-12-02 ENCOUNTER — Encounter: Payer: Self-pay | Admitting: Emergency Medicine

## 2016-12-02 DIAGNOSIS — F17210 Nicotine dependence, cigarettes, uncomplicated: Secondary | ICD-10-CM | POA: Insufficient documentation

## 2016-12-02 DIAGNOSIS — H539 Unspecified visual disturbance: Secondary | ICD-10-CM | POA: Insufficient documentation

## 2016-12-02 DIAGNOSIS — Z79899 Other long term (current) drug therapy: Secondary | ICD-10-CM | POA: Diagnosis not present

## 2016-12-02 DIAGNOSIS — R42 Dizziness and giddiness: Secondary | ICD-10-CM | POA: Insufficient documentation

## 2016-12-02 DIAGNOSIS — H547 Unspecified visual loss: Secondary | ICD-10-CM | POA: Diagnosis not present

## 2016-12-02 DIAGNOSIS — E039 Hypothyroidism, unspecified: Secondary | ICD-10-CM | POA: Diagnosis not present

## 2016-12-02 LAB — COMPREHENSIVE METABOLIC PANEL
ALBUMIN: 4.1 g/dL (ref 3.5–5.0)
ALK PHOS: 53 U/L (ref 38–126)
ALT: 12 U/L — AB (ref 17–63)
AST: 20 U/L (ref 15–41)
Anion gap: 11 (ref 5–15)
BILIRUBIN TOTAL: 0.9 mg/dL (ref 0.3–1.2)
BUN: 13 mg/dL (ref 6–20)
CALCIUM: 9.2 mg/dL (ref 8.9–10.3)
CO2: 25 mmol/L (ref 22–32)
CREATININE: 1.19 mg/dL (ref 0.61–1.24)
Chloride: 102 mmol/L (ref 101–111)
GFR calc Af Amer: >60 mL/min (ref >60)
GFR calc non Af Amer: >60 mL/min (ref >60)
GLUCOSE: 88 mg/dL (ref 65–99)
Potassium: 3.9 mmol/L (ref 3.5–5.1)
Sodium: 138 mmol/L (ref 135–145)
TOTAL PROTEIN: 6.9 g/dL (ref 6.5–8.1)

## 2016-12-02 LAB — CBC
HEMATOCRIT: 42.9 % (ref 40.0–52.0)
HEMOGLOBIN: 14.8 g/dL (ref 13.0–18.0)
MCH: 28.4 pg (ref 26.0–34.0)
MCHC: 34.6 g/dL (ref 32.0–36.0)
MCV: 82.1 fL (ref 80.0–100.0)
Platelets: 220 10*3/uL (ref 150–440)
RBC: 5.23 MIL/uL (ref 4.40–5.90)
RDW: 13.9 % (ref 11.5–14.5)
WBC: 10.1 10*3/uL (ref 3.8–10.6)

## 2016-12-02 LAB — SEDIMENTATION RATE: Sed Rate: 6 mm/hr (ref 0–20)

## 2016-12-02 MED ORDER — TETRACAINE HCL 0.5 % OP SOLN
OPHTHALMIC | Status: AC
  Administered 2016-12-02: 1 [drp] via OPHTHALMIC
  Filled 2016-12-02: qty 4

## 2016-12-02 MED ORDER — TETRACAINE HCL 0.5 % OP SOLN
1.0000 [drp] | Freq: Once | OPHTHALMIC | Status: AC
  Administered 2016-12-02: 1 [drp] via OPHTHALMIC
  Filled 2016-12-02: qty 2

## 2016-12-02 NOTE — ED Provider Notes (Signed)
[facility] Emergency Department Provider Note   ____________________________________________   I have reviewed the triage vital signs and the nursing notes.   HISTORY  Chief Complaint Loss of Vision   History limited by: Not Limited   HPI Dillon Singh is a 56 y.o. male who presents to the emergency department today because of vision loss   LOCATION:left eye DURATION:ten minutes TIMING: started suddenly, gradually got better SEVERITY: could only see small pinpoint CONTEXT: patient was outside working in the yard, denies any trauma, denies any recent medication change MODIFYING FACTORS: none ASSOCIATED SYMPTOMS: some feeling of pressure in the eye  Per medical record review patient has a history of hyperlipidemia.   Past Medical History:  Diagnosis Date  . Back pain   . Erectile dysfunction   . Fibromyalgia   . GERD (gastroesophageal reflux disease)   . Hip pain   . Hyperlipidemia   . Hypopituitarism ([place])   . Hypothyroidism     Patient Active Problem List   Diagnosis Date Noted  . Lumbar back pain 08/01/2013  . Viral syndrome 03/21/2013  . Anxiety 11/17/2011  . General medical examination 03/30/2011  . Skin lesion 09/13/2010  . HYPERLIPIDEMIA 08/12/2009  . ERECTILE DYSFUNCTION 08/15/2008  . PAIN IN JOINT, MULTIPLE SITES 08/15/2008  . DEPRESSIVE DISORDER 07/17/2008  . FIBROMYALGIA 07/17/2008  . RASH-NONVESICULAR 07/17/2008  . INSOMNIA-SLEEP DISORDER-UNSPEC 06/17/2008  . TOBACCO USE 04/03/2008  . NECK MASS 03/11/2008  . ACUTE LYMPHADENITIS 02/26/2008  . HYPOTHYROIDISM 02/04/2008  . 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 BACK PAIN, CHRONIC 02/04/2008  . DIZZINESS 02/04/2008    Past Surgical History:  Procedure Laterality Date  . APPENDECTOMY    . LUMBAR LAMINECTOMY/DECOMPRESSION MICRODISCECTOMY Left 05/11/2016   Procedure: LUMBAR LAMINECTOMY/DECOMPRESSION MICRODISCECTOMY L2-3;  Surgeon: Meade Maw, MD;   Location: [facility] ORS;  Service: Neurosurgery;  Laterality: Left;  . 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 Authorizing Provider  atorvastatin (LIPITOR) 20 MG tablet TAKE 1 TABLET BY MOUTH AT BEDTIME.    Midge Minium, MD  carisoprodol (SOMA) 350 MG tablet TAKE 1 TABLET BY MOUTH 3 TIMES A DAY Patient not taking: Reported on 05/04/2016 10/02/13   Midge Minium, MD  cholecalciferol (VITAMIN D) 1000 units tablet Take 1,000 Units by mouth daily.    [provider]  cyclobenzaprine (FLEXERIL) 10 MG tablet Take 1 tablet (10 mg total) by mouth 3 (three) times daily as needed for muscle spasms. 05/11/16   Meade Maw, MD  diazepam (VALIUM) 5 MG tablet Take 5 mg by mouth 3 (three) times daily.    [provider]  famotidine (PEPCID) 20 MG tablet Take 1 tablet (20 mg total) by mouth 2 (two) times daily. 04/25/16   Carrie Mew, MD  ibuprofen (ADVIL,MOTRIN) 200 MG tablet Take 200 mg by mouth every 8 (eight) hours as needed for mild pain.     [provider]  levothyroxine (SYNTHROID, LEVOTHROID) 100 MCG tablet Take 100 mcg by mouth daily before breakfast.    [provider]  methylPREDNISolone (MEDROL DOSEPAK) 4 MG TBPK tablet FOLLOW PACKAGE DIRECTIONS. START ON 04/30/16 04/29/16   [provider]  naproxen (NAPROSYN) 500 MG tablet Take 1 tablet (500 mg total) by mouth 2 (two) times daily with a meal. 04/25/16   Carrie Mew, MD  oxyCODONE-acetaminophen (PERCOCET/ROXICET) 5-325 MG tablet Take 1 tablet by mouth every 8 (eight)  hours as needed for severe pain.    [provider]  pregabalin (LYRICA) 150 MG capsule Take 150 mg by mouth 2 (two) times daily.    [provider]  venlafaxine XR (EFFEXOR-XR) 150 MG 24 hr capsule TAKE ONE CAPSULE BY MOUTH EVERY DAY Patient not taking: Reported on 05/04/2016 09/05/13   Midge Minium, MD     Allergies Patient has no known allergies.  Family History  Problem Relation Age of Onset  . Diabetes Mother     Social History Social History  Substance Use Topics  . Smoking status: Current Every Day Smoker    Packs/day: 1.00    Types: Cigarettes  . Smokeless tobacco: Never Used  . Alcohol use No    Review of Systems Constitutional: No fever/chills Eyes: Positive for left vision loss.  ENT: No sore throat. Cardiovascular: Denies chest pain. Respiratory: Denies shortness of breath. Gastrointestinal: No abdominal pain.  No nausea, no vomiting.  No diarrhea.   Genitourinary: Negative for dysuria. Musculoskeletal: Negative for back pain. Skin: Negative for rash. Neurological: Negative for headaches, focal weakness or numbness.  ____________________________________________   PHYSICAL EXAM:  VITAL SIGNS: ED Triage Vitals  Enc Vitals Group     BP 12/02/16 1107 127/70     Pulse Rate 12/02/16 1107 73     Resp 12/02/16 1107 18     Temp 12/02/16 1107 98 F (36.7 C)     Temp Source 12/02/16 1107 Oral     SpO2 12/02/16 1107 97 %     Weight 12/02/16 1108 175 lb (79.4 kg)     Height 12/02/16 1108 5\' 10"  (1.778 m)     Head Circumference --      Peak Flow --      Pain Score 12/02/16 1116 0   Constitutional: Alert and oriented. Well appearing and in no distress. Eyes: Conjunctivae are normal. Intraocular pressure 23 on the right eye, 16 on the left eye. ENT   Head: Normocephalic and atraumatic.   Nose: No congestion/rhinnorhea.   Mouth/Throat: Mucous membranes are moist.   Neck: No stridor. Hematological/Lymphatic/Immunilogical: No cervical lymphadenopathy. Cardiovascular: Normal rate, regular rhythm.  No murmurs, rubs, or gallops.  Respiratory: Normal respiratory effort without tachypnea nor retractions. Breath sounds are clear and equal bilaterally. No wheezes/rales/rhonchi. Gastrointestinal: Soft and non tender. No rebound. No guarding.   Genitourinary: Deferred Musculoskeletal: Normal range of motion in all extremities. No lower extremity edema. Neurologic:  Normal speech and language. No gross focal neurologic deficits are appreciated.  Skin:  Skin is warm, dry and intact. No rash noted. Psychiatric: Mood and affect are normal. Speech and behavior are normal. Patient exhibits appropriate insight and judgment.  ____________________________________________    LABS (pertinent positives/negatives)  CBC wnl CMP wnl save for ALT of 12  ____________________________________________   EKG  I, Nance Pear, attending physician, personally viewed and interpreted this EKG  EKG Time: 1640 Rate: 60 Rhythm: normal sinus rhythm Axis: normal Intervals: qtc 398 QRS: incomplete RBBB ST changes: no st elevation Impression: borderline EKG  ____________________________________________    RADIOLOGY  CT head No acute intracranial process  ____________________________________________   PROCEDURES  Procedures  ____________________________________________   INITIAL IMPRESSION / ASSESSMENT AND PLAN / ED COURSE  Pertinent labs & imaging results that were available during my care of the patient were reviewed by me and considered in my medical decision making (see chart for details).  Patient presents to the emergency department today because of concern for vision loss in the left  eye. Patient's symptoms have resolved. ddx would include TIA, temporal arteritis, vasculitis, retinal detachment amongst other etiologies. Patient ct scan negative. Sed rate wnl. Funduscopic exam without any clear abnormalities. At this point do have concern for TIA. Offered admission to the patient however he opted to be discharged. Did explain that he is likely at higher risk for true stroke. Discussed and stressed with patient importance of follow up with his primary care physician. Did also suggest patient start taking aspirin. Discussed  return precautions.  ____________________________________________   FINAL CLINICAL IMPRESSION(S) / ED DIAGNOSES  Final diagnoses:  Monocular visual disturbance     Note: This dictation was prepared with Dragon dictation. Any transcriptional errors that result from this process are unintentional     Nance Pear, MD 12/02/16 1859

## 2016-12-02 NOTE — Discharge Instructions (Signed)
Please seek medical attention for any high fevers, chest pain, shortness of breath, change in behavior, persistent vomiting, bloody stool or any other new or concerning symptoms.  

## 2016-12-02 NOTE — ED Triage Notes (Signed)
Pt to ed with c/o loss of vision in left eye about 30 min pta.  Pt states vision became pinpoint and lasted about 5 minutes then resumed normal again.  Pt denies any vision issues at this time. Pt denies pain with incident.  Denies loss of memory, denies headache.

## 2016-12-02 NOTE — ED Notes (Signed)
FIRST NURSE NOTE: Pt reports loss of vision in left eye that started about 20 minutes PTA. Pt states that his vision has came back but he is having pressure behind his left eye now. Pt ambulatory at this time.

## 2016-12-02 NOTE — ED Notes (Addendum)
Pt states he loss vision in his left eye earlier this morning while picking stick up out of his yard. Pt has hx of detatched retina but does not recall which eye.

## 2016-12-05 ENCOUNTER — Other Ambulatory Visit: Payer: Self-pay | Admitting: Ophthalmology

## 2016-12-05 DIAGNOSIS — G43109 Migraine with aura, not intractable, without status migrainosus: Secondary | ICD-10-CM

## 2016-12-05 DIAGNOSIS — G453 Amaurosis fugax: Secondary | ICD-10-CM | POA: Diagnosis not present

## 2016-12-07 ENCOUNTER — Ambulatory Visit
Admission: RE | Admit: 2016-12-07 | Discharge: 2016-12-07 | Disposition: A | Discharge status: Home / Self Care | Payer: Medicare HMO | Source: Ambulatory Visit | Attending: Ophthalmology | Admitting: Ophthalmology

## 2016-12-07 DIAGNOSIS — G43109 Migraine with aura, not intractable, without status migrainosus: Secondary | ICD-10-CM | POA: Diagnosis not present

## 2016-12-07 DIAGNOSIS — I6521 Occlusion and stenosis of right carotid artery: Secondary | ICD-10-CM | POA: Diagnosis not present

## 2016-12-07 DIAGNOSIS — I6523 Occlusion and stenosis of bilateral carotid arteries: Secondary | ICD-10-CM | POA: Diagnosis not present

## 2016-12-07 DIAGNOSIS — G453 Amaurosis fugax: Secondary | ICD-10-CM

## 2017-01-23 DIAGNOSIS — E559 Vitamin D deficiency, unspecified: Secondary | ICD-10-CM | POA: Diagnosis not present

## 2017-01-23 DIAGNOSIS — E039 Hypothyroidism, unspecified: Secondary | ICD-10-CM | POA: Diagnosis not present

## 2017-01-23 DIAGNOSIS — R972 Elevated prostate specific antigen [PSA]: Secondary | ICD-10-CM | POA: Diagnosis not present

## 2017-01-23 DIAGNOSIS — E291 Testicular hypofunction: Secondary | ICD-10-CM | POA: Diagnosis not present

## 2017-01-23 DIAGNOSIS — E236 Other disorders of pituitary gland: Secondary | ICD-10-CM | POA: Diagnosis not present

## 2017-06-30 DIAGNOSIS — E782 Mixed hyperlipidemia: Secondary | ICD-10-CM | POA: Diagnosis not present

## 2017-06-30 DIAGNOSIS — E039 Hypothyroidism, unspecified: Secondary | ICD-10-CM | POA: Diagnosis not present

## 2017-06-30 DIAGNOSIS — Z125 Encounter for screening for malignant neoplasm of prostate: Secondary | ICD-10-CM | POA: Diagnosis not present

## 2017-07-24 DIAGNOSIS — E782 Mixed hyperlipidemia: Secondary | ICD-10-CM | POA: Diagnosis not present

## 2017-07-24 DIAGNOSIS — E039 Hypothyroidism, unspecified: Secondary | ICD-10-CM | POA: Diagnosis not present

## 2017-07-24 DIAGNOSIS — Z0000 Encounter for general adult medical examination without abnormal findings: Secondary | ICD-10-CM | POA: Diagnosis not present

## 2017-07-31 DIAGNOSIS — E291 Testicular hypofunction: Secondary | ICD-10-CM | POA: Diagnosis not present

## 2017-07-31 DIAGNOSIS — M6281 Muscle weakness (generalized): Secondary | ICD-10-CM | POA: Diagnosis not present

## 2017-07-31 DIAGNOSIS — E782 Mixed hyperlipidemia: Secondary | ICD-10-CM | POA: Diagnosis not present

## 2017-07-31 DIAGNOSIS — E236 Other disorders of pituitary gland: Secondary | ICD-10-CM | POA: Diagnosis not present

## 2017-07-31 DIAGNOSIS — M797 Fibromyalgia: Secondary | ICD-10-CM | POA: Diagnosis not present

## 2017-07-31 DIAGNOSIS — F411 Generalized anxiety disorder: Secondary | ICD-10-CM | POA: Diagnosis not present

## 2017-07-31 DIAGNOSIS — R972 Elevated prostate specific antigen [PSA]: Secondary | ICD-10-CM | POA: Diagnosis not present

## 2017-08-30 DIAGNOSIS — F320 Major depressive disorder, single episode, mild: Secondary | ICD-10-CM | POA: Diagnosis not present

## 2017-08-30 DIAGNOSIS — F411 Generalized anxiety disorder: Secondary | ICD-10-CM | POA: Diagnosis not present

## 2017-09-27 DIAGNOSIS — R112 Nausea with vomiting, unspecified: Secondary | ICD-10-CM | POA: Diagnosis not present

## 2017-09-27 DIAGNOSIS — R109 Unspecified abdominal pain: Secondary | ICD-10-CM | POA: Diagnosis not present

## 2017-09-27 DIAGNOSIS — F320 Major depressive disorder, single episode, mild: Secondary | ICD-10-CM | POA: Diagnosis not present

## 2017-09-27 DIAGNOSIS — F411 Generalized anxiety disorder: Secondary | ICD-10-CM | POA: Diagnosis not present

## 2017-09-27 DIAGNOSIS — R634 Abnormal weight loss: Secondary | ICD-10-CM | POA: Diagnosis not present

## 2017-09-27 DIAGNOSIS — R1084 Generalized abdominal pain: Secondary | ICD-10-CM | POA: Diagnosis not present

## 2017-09-28 DIAGNOSIS — R1084 Generalized abdominal pain: Secondary | ICD-10-CM | POA: Diagnosis not present

## 2017-09-28 DIAGNOSIS — R112 Nausea with vomiting, unspecified: Secondary | ICD-10-CM | POA: Diagnosis not present

## 2017-09-28 DIAGNOSIS — K7689 Other specified diseases of liver: Secondary | ICD-10-CM | POA: Diagnosis not present

## 2017-10-11 DIAGNOSIS — E236 Other disorders of pituitary gland: Secondary | ICD-10-CM | POA: Diagnosis not present

## 2017-10-11 DIAGNOSIS — M797 Fibromyalgia: Secondary | ICD-10-CM | POA: Diagnosis not present

## 2017-10-11 DIAGNOSIS — R110 Nausea: Secondary | ICD-10-CM | POA: Diagnosis not present

## 2017-10-11 DIAGNOSIS — E782 Mixed hyperlipidemia: Secondary | ICD-10-CM | POA: Diagnosis not present

## 2017-10-11 DIAGNOSIS — E039 Hypothyroidism, unspecified: Secondary | ICD-10-CM | POA: Diagnosis not present

## 2017-10-11 DIAGNOSIS — R634 Abnormal weight loss: Secondary | ICD-10-CM | POA: Diagnosis not present

## 2017-10-11 DIAGNOSIS — R1010 Upper abdominal pain, unspecified: Secondary | ICD-10-CM | POA: Diagnosis not present

## 2017-10-12 DIAGNOSIS — R1010 Upper abdominal pain, unspecified: Secondary | ICD-10-CM | POA: Diagnosis not present

## 2017-10-12 DIAGNOSIS — R110 Nausea: Secondary | ICD-10-CM | POA: Diagnosis not present

## 2017-10-12 DIAGNOSIS — R5383 Other fatigue: Secondary | ICD-10-CM | POA: Diagnosis not present

## 2017-10-25 DIAGNOSIS — R634 Abnormal weight loss: Secondary | ICD-10-CM | POA: Diagnosis not present

## 2017-10-25 DIAGNOSIS — Z791 Long term (current) use of non-steroidal anti-inflammatories (NSAID): Secondary | ICD-10-CM | POA: Diagnosis not present

## 2017-10-25 DIAGNOSIS — R194 Change in bowel habit: Secondary | ICD-10-CM | POA: Diagnosis not present

## 2017-10-25 DIAGNOSIS — R140 Abdominal distension (gaseous): Secondary | ICD-10-CM | POA: Diagnosis not present

## 2017-10-25 DIAGNOSIS — F1290 Cannabis use, unspecified, uncomplicated: Secondary | ICD-10-CM | POA: Diagnosis not present

## 2017-10-25 DIAGNOSIS — R112 Nausea with vomiting, unspecified: Secondary | ICD-10-CM | POA: Diagnosis not present

## 2017-11-22 DIAGNOSIS — R140 Abdominal distension (gaseous): Secondary | ICD-10-CM | POA: Diagnosis not present

## 2017-11-22 DIAGNOSIS — K219 Gastro-esophageal reflux disease without esophagitis: Secondary | ICD-10-CM | POA: Diagnosis not present

## 2017-11-22 DIAGNOSIS — K2930 Chronic superficial gastritis without bleeding: Secondary | ICD-10-CM | POA: Diagnosis not present

## 2017-11-22 DIAGNOSIS — R634 Abnormal weight loss: Secondary | ICD-10-CM | POA: Diagnosis not present

## 2017-11-22 DIAGNOSIS — K317 Polyp of stomach and duodenum: Secondary | ICD-10-CM | POA: Diagnosis not present

## 2017-11-22 DIAGNOSIS — R112 Nausea with vomiting, unspecified: Secondary | ICD-10-CM | POA: Diagnosis not present

## 2017-12-04 DIAGNOSIS — K2930 Chronic superficial gastritis without bleeding: Secondary | ICD-10-CM | POA: Diagnosis not present

## 2017-12-04 DIAGNOSIS — K317 Polyp of stomach and duodenum: Secondary | ICD-10-CM | POA: Diagnosis not present

## 2017-12-04 DIAGNOSIS — K219 Gastro-esophageal reflux disease without esophagitis: Secondary | ICD-10-CM | POA: Diagnosis not present

## 2017-12-06 ENCOUNTER — Other Ambulatory Visit: Payer: Self-pay | Admitting: Gastroenterology

## 2017-12-06 DIAGNOSIS — R634 Abnormal weight loss: Secondary | ICD-10-CM | POA: Diagnosis not present

## 2017-12-06 DIAGNOSIS — R140 Abdominal distension (gaseous): Secondary | ICD-10-CM | POA: Diagnosis not present

## 2017-12-06 DIAGNOSIS — R112 Nausea with vomiting, unspecified: Secondary | ICD-10-CM | POA: Diagnosis not present

## 2017-12-06 DIAGNOSIS — F1290 Cannabis use, unspecified, uncomplicated: Secondary | ICD-10-CM | POA: Diagnosis not present

## 2017-12-06 DIAGNOSIS — Z791 Long term (current) use of non-steroidal anti-inflammatories (NSAID): Secondary | ICD-10-CM | POA: Diagnosis not present

## 2017-12-06 DIAGNOSIS — R194 Change in bowel habit: Secondary | ICD-10-CM | POA: Diagnosis not present

## 2017-12-11 ENCOUNTER — Ambulatory Visit
Admission: RE | Admit: 2017-12-11 | Discharge: 2017-12-11 | Disposition: A | Discharge status: Home / Self Care | Payer: Medicare HMO | Source: Ambulatory Visit | Attending: Gastroenterology | Admitting: Gastroenterology

## 2017-12-11 DIAGNOSIS — R634 Abnormal weight loss: Secondary | ICD-10-CM

## 2017-12-11 DIAGNOSIS — R140 Abdominal distension (gaseous): Secondary | ICD-10-CM

## 2017-12-11 DIAGNOSIS — R112 Nausea with vomiting, unspecified: Secondary | ICD-10-CM

## 2017-12-11 MED ORDER — IOPAMIDOL (ISOVUE-300) INJECTION 61%
100.0000 mL | Freq: Once | INTRAVENOUS | Status: AC | PRN
  Administered 2017-12-11: 100 mL via INTRAVENOUS

## 2017-12-13 ENCOUNTER — Other Ambulatory Visit (HOSPITAL_COMMUNITY): Payer: Self-pay | Admitting: Gastroenterology

## 2017-12-13 DIAGNOSIS — R112 Nausea with vomiting, unspecified: Secondary | ICD-10-CM

## 2017-12-13 DIAGNOSIS — R634 Abnormal weight loss: Secondary | ICD-10-CM

## 2017-12-21 ENCOUNTER — Encounter (HOSPITAL_COMMUNITY)
Admission: RE | Admit: 2017-12-21 | Discharge: 2017-12-21 | Disposition: A | Discharge status: Home / Self Care | Payer: Medicare HMO | Source: Ambulatory Visit | Attending: Gastroenterology | Admitting: Gastroenterology

## 2017-12-21 DIAGNOSIS — R112 Nausea with vomiting, unspecified: Secondary | ICD-10-CM | POA: Insufficient documentation

## 2017-12-21 DIAGNOSIS — R634 Abnormal weight loss: Secondary | ICD-10-CM | POA: Insufficient documentation

## 2017-12-21 MED ORDER — TECHNETIUM TC 99M MEBROFENIN IV KIT
5.2000 | PACK | Freq: Once | INTRAVENOUS | Status: AC | PRN
  Administered 2017-12-21: 5.2 via INTRAVENOUS

## 2018-03-07 DIAGNOSIS — E039 Hypothyroidism, unspecified: Secondary | ICD-10-CM | POA: Diagnosis not present

## 2018-03-07 DIAGNOSIS — E782 Mixed hyperlipidemia: Secondary | ICD-10-CM | POA: Diagnosis not present

## 2018-03-14 DIAGNOSIS — E291 Testicular hypofunction: Secondary | ICD-10-CM | POA: Diagnosis not present

## 2018-03-14 DIAGNOSIS — R972 Elevated prostate specific antigen [PSA]: Secondary | ICD-10-CM | POA: Diagnosis not present

## 2018-03-14 DIAGNOSIS — E559 Vitamin D deficiency, unspecified: Secondary | ICD-10-CM | POA: Diagnosis not present

## 2018-03-14 DIAGNOSIS — E039 Hypothyroidism, unspecified: Secondary | ICD-10-CM | POA: Diagnosis not present

## 2018-03-14 DIAGNOSIS — E236 Other disorders of pituitary gland: Secondary | ICD-10-CM | POA: Diagnosis not present

## 2018-07-06 ENCOUNTER — Encounter: Payer: Self-pay | Admitting: Gastroenterology

## 2018-09-26 DIAGNOSIS — D485 Neoplasm of uncertain behavior of skin: Secondary | ICD-10-CM | POA: Diagnosis not present

## 2018-09-26 DIAGNOSIS — B078 Other viral warts: Secondary | ICD-10-CM | POA: Diagnosis not present

## 2018-09-26 DIAGNOSIS — L578 Other skin changes due to chronic exposure to nonionizing radiation: Secondary | ICD-10-CM | POA: Diagnosis not present

## 2018-09-26 DIAGNOSIS — L570 Actinic keratosis: Secondary | ICD-10-CM | POA: Diagnosis not present

## 2018-10-31 DIAGNOSIS — M797 Fibromyalgia: Secondary | ICD-10-CM | POA: Diagnosis not present

## 2018-10-31 DIAGNOSIS — Z7189 Other specified counseling: Secondary | ICD-10-CM | POA: Diagnosis not present

## 2018-10-31 DIAGNOSIS — E039 Hypothyroidism, unspecified: Secondary | ICD-10-CM | POA: Diagnosis not present

## 2018-10-31 DIAGNOSIS — E782 Mixed hyperlipidemia: Secondary | ICD-10-CM | POA: Diagnosis not present

## 2018-10-31 DIAGNOSIS — G44209 Tension-type headache, unspecified, not intractable: Secondary | ICD-10-CM | POA: Diagnosis not present

## 2018-11-05 DIAGNOSIS — G44209 Tension-type headache, unspecified, not intractable: Secondary | ICD-10-CM | POA: Diagnosis not present

## 2018-11-05 DIAGNOSIS — M797 Fibromyalgia: Secondary | ICD-10-CM | POA: Diagnosis not present

## 2018-11-05 DIAGNOSIS — E039 Hypothyroidism, unspecified: Secondary | ICD-10-CM | POA: Diagnosis not present

## 2018-11-05 DIAGNOSIS — E782 Mixed hyperlipidemia: Secondary | ICD-10-CM | POA: Diagnosis not present

## 2018-11-13 DIAGNOSIS — Z7189 Other specified counseling: Secondary | ICD-10-CM | POA: Diagnosis not present

## 2018-11-13 DIAGNOSIS — R51 Headache: Secondary | ICD-10-CM | POA: Diagnosis not present

## 2018-11-14 ENCOUNTER — Other Ambulatory Visit: Payer: Self-pay | Admitting: Internal Medicine

## 2018-11-14 DIAGNOSIS — R519 Headache, unspecified: Secondary | ICD-10-CM

## 2018-11-15 ENCOUNTER — Ambulatory Visit
Admission: RE | Admit: 2018-11-15 | Discharge: 2018-11-15 | Disposition: A | Discharge status: Home / Self Care | Payer: Medicare HMO | Source: Ambulatory Visit | Attending: Internal Medicine | Admitting: Internal Medicine

## 2018-11-15 ENCOUNTER — Other Ambulatory Visit: Payer: Self-pay

## 2018-11-15 DIAGNOSIS — R51 Headache: Secondary | ICD-10-CM | POA: Diagnosis not present

## 2018-11-15 DIAGNOSIS — R519 Headache, unspecified: Secondary | ICD-10-CM

## 2018-11-19 DIAGNOSIS — R51 Headache: Secondary | ICD-10-CM | POA: Diagnosis not present

## 2018-11-19 DIAGNOSIS — Z79899 Other long term (current) drug therapy: Secondary | ICD-10-CM | POA: Diagnosis not present

## 2018-11-19 DIAGNOSIS — Z049 Encounter for examination and observation for unspecified reason: Secondary | ICD-10-CM | POA: Diagnosis not present

## 2018-11-19 DIAGNOSIS — G4452 New daily persistent headache (NDPH): Secondary | ICD-10-CM | POA: Diagnosis not present

## 2018-11-20 DIAGNOSIS — G4452 New daily persistent headache (NDPH): Secondary | ICD-10-CM | POA: Diagnosis not present

## 2018-11-20 DIAGNOSIS — M542 Cervicalgia: Secondary | ICD-10-CM | POA: Diagnosis not present

## 2018-11-20 DIAGNOSIS — M791 Myalgia, unspecified site: Secondary | ICD-10-CM | POA: Diagnosis not present

## 2018-11-20 DIAGNOSIS — G518 Other disorders of facial nerve: Secondary | ICD-10-CM | POA: Diagnosis not present

## 2018-11-23 ENCOUNTER — Other Ambulatory Visit: Payer: Self-pay | Admitting: Specialist

## 2018-11-23 DIAGNOSIS — R519 Headache, unspecified: Secondary | ICD-10-CM

## 2018-11-26 DIAGNOSIS — M791 Myalgia, unspecified site: Secondary | ICD-10-CM | POA: Diagnosis not present

## 2018-11-26 DIAGNOSIS — M542 Cervicalgia: Secondary | ICD-10-CM | POA: Diagnosis not present

## 2018-11-26 DIAGNOSIS — G518 Other disorders of facial nerve: Secondary | ICD-10-CM | POA: Diagnosis not present

## 2018-11-26 DIAGNOSIS — G4452 New daily persistent headache (NDPH): Secondary | ICD-10-CM | POA: Diagnosis not present

## 2018-11-27 DIAGNOSIS — G5600 Carpal tunnel syndrome, unspecified upper limb: Secondary | ICD-10-CM | POA: Diagnosis not present

## 2018-11-27 DIAGNOSIS — G629 Polyneuropathy, unspecified: Secondary | ICD-10-CM | POA: Diagnosis not present

## 2018-11-29 ENCOUNTER — Other Ambulatory Visit: Payer: Self-pay

## 2018-11-29 ENCOUNTER — Ambulatory Visit
Admission: RE | Admit: 2018-11-29 | Discharge: 2018-11-29 | Disposition: A | Discharge status: Home / Self Care | Payer: Medicare HMO | Source: Ambulatory Visit | Attending: Specialist | Admitting: Specialist

## 2018-11-29 DIAGNOSIS — R519 Headache, unspecified: Secondary | ICD-10-CM

## 2018-11-29 MED ORDER — GADOBENATE DIMEGLUMINE 529 MG/ML IV SOLN
15.0000 mL | Freq: Once | INTRAVENOUS | Status: AC | PRN
  Administered 2018-11-29: 15 mL via INTRAVENOUS

## 2018-12-04 DIAGNOSIS — M542 Cervicalgia: Secondary | ICD-10-CM | POA: Diagnosis not present

## 2018-12-04 DIAGNOSIS — G4452 New daily persistent headache (NDPH): Secondary | ICD-10-CM | POA: Diagnosis not present

## 2018-12-04 DIAGNOSIS — M791 Myalgia, unspecified site: Secondary | ICD-10-CM | POA: Diagnosis not present

## 2018-12-04 DIAGNOSIS — G518 Other disorders of facial nerve: Secondary | ICD-10-CM | POA: Diagnosis not present

## 2018-12-09 ENCOUNTER — Other Ambulatory Visit: Payer: Medicare HMO

## 2018-12-10 ENCOUNTER — Other Ambulatory Visit: Payer: Self-pay | Admitting: Specialist

## 2018-12-10 DIAGNOSIS — G4452 New daily persistent headache (NDPH): Secondary | ICD-10-CM

## 2018-12-12 ENCOUNTER — Other Ambulatory Visit: Payer: Self-pay

## 2018-12-12 ENCOUNTER — Ambulatory Visit
Admission: RE | Admit: 2018-12-12 | Discharge: 2018-12-12 | Disposition: A | Discharge status: Home / Self Care | Payer: Medicare HMO | Source: Ambulatory Visit | Attending: Specialist | Admitting: Specialist

## 2018-12-12 VITALS — BP 128/71 | HR 63

## 2018-12-12 DIAGNOSIS — G4452 New daily persistent headache (NDPH): Secondary | ICD-10-CM

## 2018-12-12 DIAGNOSIS — R519 Headache, unspecified: Secondary | ICD-10-CM | POA: Diagnosis not present

## 2018-12-12 MED ORDER — IOPAMIDOL (ISOVUE-M 200) INJECTION 41%: 1.0000 mL | Freq: Once | INTRAMUSCULAR | Status: DC

## 2018-12-12 MED ORDER — METHYLPREDNISOLONE ACETATE 40 MG/ML INJ SUSP (RADIOLOG: 120.0000 mg | Freq: Once | INTRAMUSCULAR | Status: DC

## 2018-12-12 NOTE — Discharge Instructions (Signed)

## 2018-12-18 DIAGNOSIS — G4452 New daily persistent headache (NDPH): Secondary | ICD-10-CM | POA: Diagnosis not present

## 2018-12-18 DIAGNOSIS — G518 Other disorders of facial nerve: Secondary | ICD-10-CM | POA: Diagnosis not present

## 2018-12-18 DIAGNOSIS — M791 Myalgia, unspecified site: Secondary | ICD-10-CM | POA: Diagnosis not present

## 2018-12-18 DIAGNOSIS — M542 Cervicalgia: Secondary | ICD-10-CM | POA: Diagnosis not present

## 2018-12-26 DIAGNOSIS — E782 Mixed hyperlipidemia: Secondary | ICD-10-CM | POA: Diagnosis not present

## 2018-12-26 DIAGNOSIS — M797 Fibromyalgia: Secondary | ICD-10-CM | POA: Diagnosis not present

## 2018-12-26 DIAGNOSIS — Z125 Encounter for screening for malignant neoplasm of prostate: Secondary | ICD-10-CM | POA: Diagnosis not present

## 2018-12-26 DIAGNOSIS — E039 Hypothyroidism, unspecified: Secondary | ICD-10-CM | POA: Diagnosis not present

## 2019-01-01 DIAGNOSIS — M791 Myalgia, unspecified site: Secondary | ICD-10-CM | POA: Diagnosis not present

## 2019-01-01 DIAGNOSIS — G4452 New daily persistent headache (NDPH): Secondary | ICD-10-CM | POA: Diagnosis not present

## 2019-01-01 DIAGNOSIS — G518 Other disorders of facial nerve: Secondary | ICD-10-CM | POA: Diagnosis not present

## 2019-01-01 DIAGNOSIS — M542 Cervicalgia: Secondary | ICD-10-CM | POA: Diagnosis not present

## 2019-01-07 DIAGNOSIS — E782 Mixed hyperlipidemia: Secondary | ICD-10-CM | POA: Diagnosis not present

## 2019-01-07 DIAGNOSIS — E559 Vitamin D deficiency, unspecified: Secondary | ICD-10-CM | POA: Diagnosis not present

## 2019-01-07 DIAGNOSIS — E291 Testicular hypofunction: Secondary | ICD-10-CM | POA: Diagnosis not present

## 2019-01-07 DIAGNOSIS — E236 Other disorders of pituitary gland: Secondary | ICD-10-CM | POA: Diagnosis not present

## 2019-01-07 DIAGNOSIS — Z0000 Encounter for general adult medical examination without abnormal findings: Secondary | ICD-10-CM | POA: Diagnosis not present

## 2019-01-07 DIAGNOSIS — E039 Hypothyroidism, unspecified: Secondary | ICD-10-CM | POA: Diagnosis not present

## 2019-01-07 DIAGNOSIS — M797 Fibromyalgia: Secondary | ICD-10-CM | POA: Diagnosis not present

## 2019-01-10 LAB — CSF CELL COUNT WITH DIFFERENTIAL
RBC Count, CSF: 14 cells/uL — ABNORMAL HIGH (ref 0–10)
WBC, CSF: 0 cells/uL (ref 0–5)

## 2019-01-10 LAB — CSF CULTURE W GRAM STAIN
MICRO NUMBER:: 1013826
Result:: NO GROWTH
SPECIMEN QUALITY:: ADEQUATE

## 2019-01-10 LAB — MYCOBACTERIUM TUBERCULOSIS COMPLEX: MTB Complex,PCR,Non-Resp.: NOT DETECTED

## 2019-01-10 LAB — FUNGUS CULTURE W SMEAR
MICRO NUMBER:: 1013825
SMEAR:: NONE SEEN
SPECIMEN QUALITY:: ADEQUATE

## 2019-01-10 LAB — GLUCOSE, CSF: Glucose, CSF: 67 mg/dL (ref 40–80)

## 2019-01-10 LAB — CRYPTOCOCCAL AG, LTX SCR RFLX TITER
Cryptococcal Ag Screen: NOT DETECTED
MICRO NUMBER:: 1013824
SPECIMEN QUALITY:: ADEQUATE

## 2019-01-10 LAB — LYME DISEASE ABS IGG, IGM, IFA, CSF
Lyme Disease AB (IgG), IBL: NOT DETECTED
Lyme Disease AB (IgM), IBL: NOT DETECTED

## 2019-01-10 LAB — HERPES SIMPLEX VIRUS 1/2 (IGG), CSF
HSV 1 IgG Index:: 0.01
HSV 2 IgG Index:: <0.01

## 2019-01-10 LAB — ANGIOTENSIN CONVERTING ENZYME, CSF: ACE, CSF: 9 U/L (ref <=15)

## 2019-01-10 LAB — PROTEIN, CSF: Total Protein, CSF: 42 mg/dL (ref 15–45)

## 2019-01-10 LAB — VDRL, CSF: VDRL Quant, CSF: NONREACTIVE

## 2019-01-15 DIAGNOSIS — M542 Cervicalgia: Secondary | ICD-10-CM | POA: Diagnosis not present

## 2019-01-15 DIAGNOSIS — M791 Myalgia, unspecified site: Secondary | ICD-10-CM | POA: Diagnosis not present

## 2019-01-15 DIAGNOSIS — G518 Other disorders of facial nerve: Secondary | ICD-10-CM | POA: Diagnosis not present

## 2019-01-15 DIAGNOSIS — G4452 New daily persistent headache (NDPH): Secondary | ICD-10-CM | POA: Diagnosis not present

## 2019-01-28 ENCOUNTER — Other Ambulatory Visit: Payer: Self-pay

## 2019-01-28 ENCOUNTER — Emergency Department
Admission: EM | Admit: 2019-01-28 | Discharge: 2019-01-28 | Disposition: A | Discharge status: Home / Self Care | Payer: Medicare HMO | Attending: Emergency Medicine | Admitting: Emergency Medicine

## 2019-01-28 ENCOUNTER — Emergency Department: Payer: Medicare HMO

## 2019-01-28 DIAGNOSIS — M25511 Pain in right shoulder: Secondary | ICD-10-CM | POA: Diagnosis not present

## 2019-01-28 DIAGNOSIS — M25711 Osteophyte, right shoulder: Secondary | ICD-10-CM | POA: Diagnosis not present

## 2019-01-28 DIAGNOSIS — E039 Hypothyroidism, unspecified: Secondary | ICD-10-CM | POA: Insufficient documentation

## 2019-01-28 DIAGNOSIS — M7581 Other shoulder lesions, right shoulder: Secondary | ICD-10-CM | POA: Diagnosis not present

## 2019-01-28 DIAGNOSIS — F17210 Nicotine dependence, cigarettes, uncomplicated: Secondary | ICD-10-CM | POA: Diagnosis not present

## 2019-01-28 DIAGNOSIS — Z79899 Other long term (current) drug therapy: Secondary | ICD-10-CM | POA: Insufficient documentation

## 2019-01-28 MED ORDER — HYDROCODONE-ACETAMINOPHEN 5-325 MG PO TABS: 1.0000 | ORAL_TABLET | Freq: Four times a day (QID) | ORAL | 0 refills | Status: DC | PRN

## 2019-01-28 MED ORDER — DEXAMETHASONE SODIUM PHOSPHATE 10 MG/ML IJ SOLN
10.0000 mg | Freq: Once | INTRAMUSCULAR | Status: AC
  Administered 2019-01-28: 10 mg via INTRAMUSCULAR
  Filled 2019-01-28: qty 1

## 2019-01-28 MED ORDER — OXYCODONE-ACETAMINOPHEN 7.5-325 MG PO TABS
1.0000 | ORAL_TABLET | Freq: Once | ORAL | Status: AC
  Administered 2019-01-28: 1 via ORAL
  Filled 2019-01-28: qty 1

## 2019-01-28 MED ORDER — PREDNISONE 10 MG PO TABS: ORAL_TABLET | ORAL | 0 refills | Status: DC

## 2019-01-28 NOTE — Discharge Instructions (Signed)
Follow-up with your primary care provider or Dr. Roland Rack who is on-call for orthopedics.  Begin taking the prednisone as directed starting today.  The Norco is hydrocodone.  This is a pain medication.  Do not drive or operate machinery while taking this medication as it could cause drowsiness and increase your risk for injury.  You may use ice or heat to your shoulder as needed for discomfort.  Wear the sling no more than 5 days which could cause you to have a "frozen shoulder".

## 2019-01-28 NOTE — ED Triage Notes (Signed)
Pt states right shoulder pain since Friday. Pt states he was inserting screws into a wall when he began to have shoulder pain that has progressively gotten worse. Pt states he is not able to move shoulder without pain and is self splinting. Cms intact to fingers.

## 2019-01-28 NOTE — ED Notes (Signed)
Pt placed on arm sling  States having increased pain with movement  Provider aware  Po pain meds given

## 2019-01-28 NOTE — ED Notes (Signed)
Pt in x-ray. Logan, RT will bring pt to room.

## 2019-01-28 NOTE — ED Provider Notes (Signed)
[facility] Emergency Department Provider Note  ____________________________________________   None    (approximate)  I have reviewed the triage vital signs and the nursing notes.   HISTORY  Chief Complaint Shoulder Pain   HPI Dillon Singh is a 58 y.o. male presents to the ED with complaint of right shoulder pain.  Patient denies any injury to his shoulder.  He states that he was using a screwdriver to put screws in over the weekend.  He states that after this it has progressively gotten worse.  Patient states he is not able to move his shoulder without severe pain.  Patient denies any previous injury to his right shoulder.  He has not taken any over-the-counter medication for his shoulder pain.  He rates his pain as an 8 out of 10.     Past Medical History:  Diagnosis Date  . Back pain   . Erectile dysfunction   . Fibromyalgia   . GERD (gastroesophageal reflux disease)   . Hip pain   . Hyperlipidemia   . Hypopituitarism ([place])   . Hypothyroidism     Patient Active Problem List   Diagnosis Date Noted  . Lumbar back pain 08/01/2013  . Viral syndrome 03/21/2013  . Anxiety 11/17/2011  . General medical examination 03/30/2011  . Skin lesion 09/13/2010  . HYPERLIPIDEMIA 08/12/2009  . ERECTILE DYSFUNCTION 08/15/2008  . PAIN IN JOINT, MULTIPLE SITES 08/15/2008  . DEPRESSIVE DISORDER 07/17/2008  . FIBROMYALGIA 07/17/2008  . RASH-NONVESICULAR 07/17/2008  . INSOMNIA-SLEEP DISORDER-UNSPEC 06/17/2008  . TOBACCO USE 04/03/2008  . NECK MASS 03/11/2008  . ACUTE LYMPHADENITIS 02/26/2008  . HYPOTHYROIDISM 02/04/2008  . 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 BACK PAIN, CHRONIC 02/04/2008  . DIZZINESS 02/04/2008    Past Surgical History:  Procedure Laterality Date  . APPENDECTOMY    . LUMBAR LAMINECTOMY/DECOMPRESSION MICRODISCECTOMY Left 05/11/2016   Procedure: LUMBAR LAMINECTOMY/DECOMPRESSION MICRODISCECTOMY L2-3;  Surgeon: Meade Maw, MD;  Location: [facility] ORS;  Service: Neurosurgery;  Laterality: Left;  . 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 Authorizing Provider  atorvastatin (LIPITOR) 20 MG tablet TAKE 1 TABLET BY MOUTH AT BEDTIME.    Midge Minium, MD  cholecalciferol (VITAMIN D) 1000 units tablet Take 1,000 Units by mouth daily.    [provider]  diazepam (VALIUM) 5 MG tablet Take 5 mg by mouth 3 (three) times daily.    [provider]  escitalopram (LEXAPRO) 10 MG tablet Take 10 mg by mouth daily. 10/22/18   [provider]  famotidine (PEPCID) 20 MG tablet Take 1 tablet (20 mg total) by mouth 2 (two) times daily. 04/25/16   Carrie Mew, MD  HYDROcodone-acetaminophen (NORCO/VICODIN) 5-325 MG tablet Take 1 tablet by mouth every 6 (six) hours as needed for moderate pain. 01/28/19   Johnn Hai, PA-C  levothyroxine (SYNTHROID, LEVOTHROID) 100 MCG tablet Take 100 mcg by mouth daily before breakfast.    [provider]  predniSONE (DELTASONE) 10 MG tablet Take 6 tablets  today, on day 2 take 5 tablets, day 3 take 4 tablets, day 4 take 3 tablets, day 5 take  2 tablets and 1 tablet the last day 01/28/19   Johnn Hai, PA-C  pregabalin (LYRICA) 150 MG capsule Take 150 mg by mouth 2 (two) times daily.    [provider]  tiZANidine (ZANAFLEX) 4 MG tablet TAKE 1 TABLET  BY MOUTH 3 TIMES A DAY AS NEEDED FOR 20 DAYS 10/31/18   [provider]    Allergies Patient has no known allergies.  Family History  Problem Relation Age of Onset  . Diabetes Mother     Social History Social History   Tobacco Use  . Smoking status: Current Every Day Smoker    Packs/day: 1.00    Types: Cigarettes  . Smokeless tobacco: Never Used  Substance Use Topics  . Alcohol use: No  . Drug use: No    Review of Systems Constitutional: No fever/chills Cardiovascular: Denies chest  pain. Respiratory: Denies shortness of breath. Musculoskeletal: Right shoulder pain positive. Skin: No complaints. Neurological: Negative for  focal weakness or numbness. ____________________________________________   PHYSICAL EXAM:  VITAL SIGNS: ED Triage Vitals  Enc Vitals Group     BP 01/28/19 0651 119/60     Pulse Rate 01/28/19 0651 77     Resp 01/28/19 0651 18     Temp 01/28/19 0651 98 F (36.7 C)     Temp Source 01/28/19 0651 Oral     SpO2 01/28/19 0651 100 %     Weight 01/28/19 0652 170 lb (77.1 kg)     Height 01/28/19 0652 5\' 10"  (1.778 m)     Head Circumference --      Peak Flow --      Pain Score 01/28/19 0651 8     Pain Loc --      Pain Edu? --      Excl. in [place]? --    Constitutional: Alert and oriented. Well appearing and in no acute distress. Eyes: Conjunctivae are normal.  Head: Atraumatic. Neck: No stridor.   Cardiovascular: Normal rate, regular rhythm. Grossly normal heart sounds.  Good peripheral circulation. Respiratory: Normal respiratory effort.  No retractions. Lungs CTAB. Musculoskeletal: On examination of the right shoulder there is no gross deformity however patient is unable to do any range of motion without severe pain.  No edema or discoloration.  Patient is markedly tender in the [facility] joint area.  He is splinting his arm against his body.  Pulses present.  Capillary refills less than 3 seconds and skin is intact. Neurologic:  Normal speech and language. No gross focal neurologic deficits are appreciated.  Skin:  Skin is warm, dry and intact.  Psychiatric: Mood and affect are normal. Speech and behavior are normal.  ____________________________________________   LABS (all labs ordered are listed, but only abnormal results are displayed)  Labs Reviewed - No data to display  RADIOLOGY  Official radiology report(s): Dg Shoulder Right  Result Date: 01/28/2019 CLINICAL DATA:  Right shoulder pain since Friday EXAM: RIGHT SHOULDER - 2+ VIEW  COMPARISON:  None. FINDINGS: There is no evidence of fracture or dislocation. No soft tissue calcification. Mild degenerative spurring projecting superiorly at the acromioclavicular joint. IMPRESSION: 1. No acute finding. 2. Mild spurring at the [facility] joint. Electronically Signed   By: Monte Fantasia M.D.   On: 01/28/2019 07:23    ____________________________________________   PROCEDURES  Procedure(s) performed (including Critical Care):  Procedures  ____________________________________________   INITIAL IMPRESSION / ASSESSMENT AND PLAN / ED COURSE  As part of my medical decision making, I reviewed the following data within the electronic MEDICAL RECORD NUMBER Notes from prior ED visits and [place] Controlled Substance Database  58 year old male presents to the ED with complaint of right shoulder pain.  Patient denies any recent injury.  Range of motion reproduces patient's pain.  X-ray shows spurring at  the [facility] joint where patient is markedly tender.  Patient was given Decadron 10 mg IM and Percocet while in the ED.  He is encouraged to use moist heat or ice to his shoulder as needed for discomfort.  A sling was given to the patient for added support.  He will continue with the prednisone and a prescription for pain medication was sent to his pharmacy.  He is to follow-up with Dr. Roland Rack if any continued problems as he is the orthopedist on call today.  ____________________________________________   FINAL CLINICAL IMPRESSION(S) / ED DIAGNOSES  Final diagnoses:  Acute pain of right shoulder  Bone spur of right acromioclavicular joint     ED Discharge Orders         Ordered    HYDROcodone-acetaminophen (NORCO/VICODIN) 5-325 MG tablet  Every 6 hours PRN     01/28/19 0743    predniSONE (DELTASONE) 10 MG tablet     01/28/19 [id number]           Note:  This document was prepared using Dragon voice recognition software and may include unintentional dictation errors.    Johnn Hai, PA-C  01/28/19 1059    Blake Divine, MD 01/29/19 [phone]

## 2019-01-28 NOTE — ED Notes (Signed)
See triage note.  Presents with right shoulder pain  Denies any specific injury  But developed pain after using arm to put in screws  Good pulse  No deformity noted

## 2019-02-04 DIAGNOSIS — M797 Fibromyalgia: Secondary | ICD-10-CM | POA: Diagnosis not present

## 2019-02-05 DIAGNOSIS — M7581 Other shoulder lesions, right shoulder: Secondary | ICD-10-CM | POA: Diagnosis not present

## 2019-02-05 DIAGNOSIS — M19011 Primary osteoarthritis, right shoulder: Secondary | ICD-10-CM | POA: Diagnosis not present

## 2019-02-05 DIAGNOSIS — M7521 Bicipital tendinitis, right shoulder: Secondary | ICD-10-CM | POA: Diagnosis not present

## 2019-03-11 DIAGNOSIS — E039 Hypothyroidism, unspecified: Secondary | ICD-10-CM | POA: Diagnosis not present

## 2019-03-11 DIAGNOSIS — E291 Testicular hypofunction: Secondary | ICD-10-CM | POA: Diagnosis not present

## 2019-03-11 DIAGNOSIS — E236 Other disorders of pituitary gland: Secondary | ICD-10-CM | POA: Diagnosis not present

## 2019-03-15 DIAGNOSIS — G4452 New daily persistent headache (NDPH): Secondary | ICD-10-CM | POA: Diagnosis not present

## 2019-03-15 DIAGNOSIS — E291 Testicular hypofunction: Secondary | ICD-10-CM | POA: Diagnosis not present

## 2019-03-15 DIAGNOSIS — G518 Other disorders of facial nerve: Secondary | ICD-10-CM | POA: Diagnosis not present

## 2019-03-15 DIAGNOSIS — E039 Hypothyroidism, unspecified: Secondary | ICD-10-CM | POA: Diagnosis not present

## 2019-03-15 DIAGNOSIS — M791 Myalgia, unspecified site: Secondary | ICD-10-CM | POA: Diagnosis not present

## 2019-03-15 DIAGNOSIS — E559 Vitamin D deficiency, unspecified: Secondary | ICD-10-CM | POA: Diagnosis not present

## 2019-03-15 DIAGNOSIS — E236 Other disorders of pituitary gland: Secondary | ICD-10-CM | POA: Diagnosis not present

## 2019-03-15 DIAGNOSIS — M542 Cervicalgia: Secondary | ICD-10-CM | POA: Diagnosis not present

## 2019-04-29 DIAGNOSIS — E039 Hypothyroidism, unspecified: Secondary | ICD-10-CM | POA: Diagnosis not present

## 2019-04-29 DIAGNOSIS — E782 Mixed hyperlipidemia: Secondary | ICD-10-CM | POA: Diagnosis not present

## 2019-06-12 DIAGNOSIS — M25559 Pain in unspecified hip: Secondary | ICD-10-CM | POA: Diagnosis not present

## 2019-06-12 DIAGNOSIS — M797 Fibromyalgia: Secondary | ICD-10-CM | POA: Diagnosis not present

## 2019-06-12 DIAGNOSIS — E782 Mixed hyperlipidemia: Secondary | ICD-10-CM | POA: Diagnosis not present

## 2019-06-12 DIAGNOSIS — M7070 Other bursitis of hip, unspecified hip: Secondary | ICD-10-CM | POA: Diagnosis not present

## 2019-06-12 DIAGNOSIS — F320 Major depressive disorder, single episode, mild: Secondary | ICD-10-CM | POA: Diagnosis not present

## 2019-07-04 DIAGNOSIS — M19042 Primary osteoarthritis, left hand: Secondary | ICD-10-CM | POA: Diagnosis not present

## 2019-07-04 DIAGNOSIS — S68111A Complete traumatic metacarpophalangeal amputation of left index finger, initial encounter: Secondary | ICD-10-CM | POA: Diagnosis not present

## 2019-07-04 DIAGNOSIS — M7062 Trochanteric bursitis, left hip: Secondary | ICD-10-CM | POA: Diagnosis not present

## 2019-07-04 DIAGNOSIS — M79642 Pain in left hand: Secondary | ICD-10-CM | POA: Diagnosis not present

## 2019-07-04 DIAGNOSIS — M19041 Primary osteoarthritis, right hand: Secondary | ICD-10-CM | POA: Diagnosis not present

## 2019-07-04 DIAGNOSIS — M79641 Pain in right hand: Secondary | ICD-10-CM | POA: Diagnosis not present

## 2019-07-09 DIAGNOSIS — M25511 Pain in right shoulder: Secondary | ICD-10-CM | POA: Diagnosis not present

## 2019-07-09 DIAGNOSIS — M19011 Primary osteoarthritis, right shoulder: Secondary | ICD-10-CM | POA: Diagnosis not present

## 2019-09-10 DIAGNOSIS — Z0100 Encounter for examination of eyes and vision without abnormal findings: Secondary | ICD-10-CM | POA: Diagnosis not present

## 2019-09-10 DIAGNOSIS — E7800 Pure hypercholesterolemia, unspecified: Secondary | ICD-10-CM | POA: Diagnosis not present

## 2019-09-10 DIAGNOSIS — H25813 Combined forms of age-related cataract, bilateral: Secondary | ICD-10-CM | POA: Diagnosis not present

## 2019-09-10 DIAGNOSIS — H524 Presbyopia: Secondary | ICD-10-CM | POA: Diagnosis not present

## 2019-09-10 DIAGNOSIS — Z135 Encounter for screening for eye and ear disorders: Secondary | ICD-10-CM | POA: Diagnosis not present

## 2019-09-10 DIAGNOSIS — H40033 Anatomical narrow angle, bilateral: Secondary | ICD-10-CM | POA: Diagnosis not present

## 2019-10-02 DIAGNOSIS — E782 Mixed hyperlipidemia: Secondary | ICD-10-CM | POA: Diagnosis not present

## 2019-10-02 DIAGNOSIS — M797 Fibromyalgia: Secondary | ICD-10-CM | POA: Diagnosis not present

## 2019-10-02 DIAGNOSIS — F320 Major depressive disorder, single episode, mild: Secondary | ICD-10-CM | POA: Diagnosis not present

## 2019-10-09 DIAGNOSIS — E782 Mixed hyperlipidemia: Secondary | ICD-10-CM | POA: Diagnosis not present

## 2019-10-09 DIAGNOSIS — E039 Hypothyroidism, unspecified: Secondary | ICD-10-CM | POA: Diagnosis not present

## 2019-10-09 DIAGNOSIS — M797 Fibromyalgia: Secondary | ICD-10-CM | POA: Diagnosis not present

## 2019-10-09 DIAGNOSIS — M25559 Pain in unspecified hip: Secondary | ICD-10-CM | POA: Diagnosis not present

## 2019-11-04 DIAGNOSIS — M25552 Pain in left hip: Secondary | ICD-10-CM | POA: Diagnosis not present

## 2019-11-04 DIAGNOSIS — M19042 Primary osteoarthritis, left hand: Secondary | ICD-10-CM | POA: Diagnosis not present

## 2019-11-04 DIAGNOSIS — M7061 Trochanteric bursitis, right hip: Secondary | ICD-10-CM | POA: Diagnosis not present

## 2019-11-04 DIAGNOSIS — M19041 Primary osteoarthritis, right hand: Secondary | ICD-10-CM | POA: Diagnosis not present

## 2019-11-04 DIAGNOSIS — M25551 Pain in right hip: Secondary | ICD-10-CM | POA: Diagnosis not present

## 2019-11-04 DIAGNOSIS — M7062 Trochanteric bursitis, left hip: Secondary | ICD-10-CM | POA: Diagnosis not present

## 2019-11-04 DIAGNOSIS — R768 Other specified abnormal immunological findings in serum: Secondary | ICD-10-CM | POA: Diagnosis not present

## 2020-01-07 DIAGNOSIS — R768 Other specified abnormal immunological findings in serum: Secondary | ICD-10-CM | POA: Diagnosis not present

## 2020-01-07 DIAGNOSIS — M19041 Primary osteoarthritis, right hand: Secondary | ICD-10-CM | POA: Diagnosis not present

## 2020-01-07 DIAGNOSIS — M19042 Primary osteoarthritis, left hand: Secondary | ICD-10-CM | POA: Diagnosis not present

## 2020-01-07 DIAGNOSIS — M7061 Trochanteric bursitis, right hip: Secondary | ICD-10-CM | POA: Diagnosis not present

## 2020-01-07 DIAGNOSIS — M7062 Trochanteric bursitis, left hip: Secondary | ICD-10-CM | POA: Diagnosis not present

## 2020-01-27 DIAGNOSIS — M797 Fibromyalgia: Secondary | ICD-10-CM | POA: Diagnosis not present

## 2020-01-27 DIAGNOSIS — E782 Mixed hyperlipidemia: Secondary | ICD-10-CM | POA: Diagnosis not present

## 2020-01-27 DIAGNOSIS — Z125 Encounter for screening for malignant neoplasm of prostate: Secondary | ICD-10-CM | POA: Diagnosis not present

## 2020-01-27 DIAGNOSIS — E039 Hypothyroidism, unspecified: Secondary | ICD-10-CM | POA: Diagnosis not present

## 2020-02-03 DIAGNOSIS — I700 Atherosclerosis of aorta: Secondary | ICD-10-CM | POA: Diagnosis not present

## 2020-02-03 DIAGNOSIS — Z0000 Encounter for general adult medical examination without abnormal findings: Secondary | ICD-10-CM | POA: Diagnosis not present

## 2020-02-03 DIAGNOSIS — F320 Major depressive disorder, single episode, mild: Secondary | ICD-10-CM | POA: Diagnosis not present

## 2020-02-03 DIAGNOSIS — E236 Other disorders of pituitary gland: Secondary | ICD-10-CM | POA: Diagnosis not present

## 2020-04-13 DIAGNOSIS — E782 Mixed hyperlipidemia: Secondary | ICD-10-CM | POA: Diagnosis not present

## 2020-04-13 DIAGNOSIS — E291 Testicular hypofunction: Secondary | ICD-10-CM | POA: Diagnosis not present

## 2020-04-13 DIAGNOSIS — E236 Other disorders of pituitary gland: Secondary | ICD-10-CM | POA: Diagnosis not present

## 2020-04-13 DIAGNOSIS — E039 Hypothyroidism, unspecified: Secondary | ICD-10-CM | POA: Diagnosis not present

## 2020-04-20 DIAGNOSIS — E236 Other disorders of pituitary gland: Secondary | ICD-10-CM | POA: Diagnosis not present

## 2020-04-20 DIAGNOSIS — E559 Vitamin D deficiency, unspecified: Secondary | ICD-10-CM | POA: Diagnosis not present

## 2020-04-20 DIAGNOSIS — E291 Testicular hypofunction: Secondary | ICD-10-CM | POA: Diagnosis not present

## 2020-04-20 DIAGNOSIS — E039 Hypothyroidism, unspecified: Secondary | ICD-10-CM | POA: Diagnosis not present

## 2020-04-22 DIAGNOSIS — R768 Other specified abnormal immunological findings in serum: Secondary | ICD-10-CM | POA: Diagnosis not present

## 2020-04-22 DIAGNOSIS — M7630 Iliotibial band syndrome, unspecified leg: Secondary | ICD-10-CM | POA: Diagnosis not present

## 2020-04-22 DIAGNOSIS — M19041 Primary osteoarthritis, right hand: Secondary | ICD-10-CM | POA: Diagnosis not present

## 2020-04-22 DIAGNOSIS — M7062 Trochanteric bursitis, left hip: Secondary | ICD-10-CM | POA: Diagnosis not present

## 2020-04-22 DIAGNOSIS — M19042 Primary osteoarthritis, left hand: Secondary | ICD-10-CM | POA: Diagnosis not present

## 2020-05-13 DIAGNOSIS — M25552 Pain in left hip: Secondary | ICD-10-CM | POA: Diagnosis not present

## 2020-05-13 DIAGNOSIS — M25551 Pain in right hip: Secondary | ICD-10-CM | POA: Diagnosis not present

## 2020-05-19 DIAGNOSIS — M25552 Pain in left hip: Secondary | ICD-10-CM | POA: Diagnosis not present

## 2020-05-19 DIAGNOSIS — M25551 Pain in right hip: Secondary | ICD-10-CM | POA: Diagnosis not present

## 2020-05-21 DIAGNOSIS — E039 Hypothyroidism, unspecified: Secondary | ICD-10-CM | POA: Diagnosis not present

## 2020-05-21 DIAGNOSIS — E782 Mixed hyperlipidemia: Secondary | ICD-10-CM | POA: Diagnosis not present

## 2020-05-21 DIAGNOSIS — I700 Atherosclerosis of aorta: Secondary | ICD-10-CM | POA: Diagnosis not present

## 2020-05-21 DIAGNOSIS — N182 Chronic kidney disease, stage 2 (mild): Secondary | ICD-10-CM | POA: Diagnosis not present

## 2020-06-16 DIAGNOSIS — M25552 Pain in left hip: Secondary | ICD-10-CM | POA: Diagnosis not present

## 2020-06-16 DIAGNOSIS — M25551 Pain in right hip: Secondary | ICD-10-CM | POA: Diagnosis not present

## 2020-06-18 DIAGNOSIS — M25551 Pain in right hip: Secondary | ICD-10-CM | POA: Diagnosis not present

## 2020-06-18 DIAGNOSIS — M25552 Pain in left hip: Secondary | ICD-10-CM | POA: Diagnosis not present

## 2020-07-08 DIAGNOSIS — M25551 Pain in right hip: Secondary | ICD-10-CM | POA: Diagnosis not present

## 2020-07-08 DIAGNOSIS — M7062 Trochanteric bursitis, left hip: Secondary | ICD-10-CM | POA: Diagnosis not present

## 2020-07-08 DIAGNOSIS — M76891 Other specified enthesopathies of right lower limb, excluding foot: Secondary | ICD-10-CM | POA: Diagnosis not present

## 2020-07-08 DIAGNOSIS — M76892 Other specified enthesopathies of left lower limb, excluding foot: Secondary | ICD-10-CM | POA: Diagnosis not present

## 2020-07-08 DIAGNOSIS — M7061 Trochanteric bursitis, right hip: Secondary | ICD-10-CM | POA: Diagnosis not present

## 2020-07-08 DIAGNOSIS — G8929 Other chronic pain: Secondary | ICD-10-CM | POA: Diagnosis not present

## 2020-07-08 DIAGNOSIS — M25552 Pain in left hip: Secondary | ICD-10-CM | POA: Diagnosis not present

## 2020-07-21 DIAGNOSIS — E039 Hypothyroidism, unspecified: Secondary | ICD-10-CM | POA: Diagnosis not present

## 2020-07-21 DIAGNOSIS — E782 Mixed hyperlipidemia: Secondary | ICD-10-CM | POA: Diagnosis not present

## 2020-07-21 DIAGNOSIS — N182 Chronic kidney disease, stage 2 (mild): Secondary | ICD-10-CM | POA: Diagnosis not present

## 2020-07-22 ENCOUNTER — Other Ambulatory Visit: Payer: Self-pay | Admitting: Orthopedic Surgery

## 2020-07-22 DIAGNOSIS — M76891 Other specified enthesopathies of right lower limb, excluding foot: Secondary | ICD-10-CM

## 2020-07-22 DIAGNOSIS — M76892 Other specified enthesopathies of left lower limb, excluding foot: Secondary | ICD-10-CM

## 2020-07-27 DIAGNOSIS — E782 Mixed hyperlipidemia: Secondary | ICD-10-CM | POA: Diagnosis not present

## 2020-07-29 ENCOUNTER — Other Ambulatory Visit: Payer: Self-pay | Admitting: Orthopedic Surgery

## 2020-07-29 ENCOUNTER — Ambulatory Visit
Admission: RE | Admit: 2020-07-29 | Discharge: 2020-07-29 | Disposition: A | Discharge status: Home / Self Care | Payer: Medicare HMO | Source: Ambulatory Visit | Attending: Orthopedic Surgery | Admitting: Orthopedic Surgery

## 2020-07-29 ENCOUNTER — Other Ambulatory Visit: Payer: Self-pay

## 2020-07-29 DIAGNOSIS — M1611 Unilateral primary osteoarthritis, right hip: Secondary | ICD-10-CM | POA: Diagnosis not present

## 2020-07-29 DIAGNOSIS — M76892 Other specified enthesopathies of left lower limb, excluding foot: Secondary | ICD-10-CM | POA: Insufficient documentation

## 2020-07-29 DIAGNOSIS — M76891 Other specified enthesopathies of right lower limb, excluding foot: Secondary | ICD-10-CM

## 2020-07-29 DIAGNOSIS — M1612 Unilateral primary osteoarthritis, left hip: Secondary | ICD-10-CM | POA: Diagnosis not present

## 2020-08-03 DIAGNOSIS — I700 Atherosclerosis of aorta: Secondary | ICD-10-CM | POA: Diagnosis not present

## 2020-08-03 DIAGNOSIS — E039 Hypothyroidism, unspecified: Secondary | ICD-10-CM | POA: Diagnosis not present

## 2020-08-03 DIAGNOSIS — E782 Mixed hyperlipidemia: Secondary | ICD-10-CM | POA: Diagnosis not present

## 2020-08-03 DIAGNOSIS — N182 Chronic kidney disease, stage 2 (mild): Secondary | ICD-10-CM | POA: Diagnosis not present

## 2020-08-05 DIAGNOSIS — M25551 Pain in right hip: Secondary | ICD-10-CM | POA: Diagnosis not present

## 2020-08-05 DIAGNOSIS — M76891 Other specified enthesopathies of right lower limb, excluding foot: Secondary | ICD-10-CM | POA: Diagnosis not present

## 2020-08-05 DIAGNOSIS — G8929 Other chronic pain: Secondary | ICD-10-CM | POA: Diagnosis not present

## 2020-08-05 DIAGNOSIS — M76892 Other specified enthesopathies of left lower limb, excluding foot: Secondary | ICD-10-CM | POA: Diagnosis not present

## 2020-08-05 DIAGNOSIS — M25552 Pain in left hip: Secondary | ICD-10-CM | POA: Diagnosis not present

## 2020-09-02 DIAGNOSIS — M7062 Trochanteric bursitis, left hip: Secondary | ICD-10-CM | POA: Diagnosis not present

## 2020-09-20 DIAGNOSIS — N182 Chronic kidney disease, stage 2 (mild): Secondary | ICD-10-CM | POA: Diagnosis not present

## 2020-09-20 DIAGNOSIS — E782 Mixed hyperlipidemia: Secondary | ICD-10-CM | POA: Diagnosis not present

## 2020-09-20 DIAGNOSIS — E039 Hypothyroidism, unspecified: Secondary | ICD-10-CM | POA: Diagnosis not present

## 2020-09-20 DIAGNOSIS — I700 Atherosclerosis of aorta: Secondary | ICD-10-CM | POA: Diagnosis not present

## 2020-10-07 DIAGNOSIS — M160 Bilateral primary osteoarthritis of hip: Secondary | ICD-10-CM | POA: Diagnosis not present

## 2020-10-08 ENCOUNTER — Other Ambulatory Visit: Payer: Self-pay | Admitting: Orthopedic Surgery

## 2020-10-22 ENCOUNTER — Other Ambulatory Visit: Payer: Self-pay

## 2020-10-22 ENCOUNTER — Encounter
Admission: RE | Admit: 2020-10-22 | Discharge: 2020-10-22 | Disposition: A | Discharge status: Home / Self Care | Payer: Medicare HMO | Source: Ambulatory Visit | Attending: Orthopedic Surgery | Admitting: Orthopedic Surgery

## 2020-10-22 DIAGNOSIS — Z01818 Encounter for other preprocedural examination: Secondary | ICD-10-CM | POA: Diagnosis not present

## 2020-10-22 DIAGNOSIS — Z01810 Encounter for preprocedural cardiovascular examination: Secondary | ICD-10-CM | POA: Diagnosis not present

## 2020-10-22 DIAGNOSIS — I493 Ventricular premature depolarization: Secondary | ICD-10-CM | POA: Insufficient documentation

## 2020-10-22 DIAGNOSIS — I4510 Unspecified right bundle-branch block: Secondary | ICD-10-CM | POA: Insufficient documentation

## 2020-10-22 HISTORY — DX: Unspecified osteoarthritis, unspecified site: M19.90

## 2020-10-22 LAB — TYPE AND SCREEN
ABO/RH(D): A POS
Antibody Screen: NEGATIVE

## 2020-10-22 LAB — COMPREHENSIVE METABOLIC PANEL
ALT: 16 U/L (ref 0–44)
AST: 19 U/L (ref 15–41)
Albumin: 4.3 g/dL (ref 3.5–5.0)
Alkaline Phosphatase: 46 U/L (ref 38–126)
Anion gap: 8 (ref 5–15)
BUN: 20 mg/dL (ref 8–23)
CO2: 27 mmol/L (ref 22–32)
Calcium: 8.9 mg/dL (ref 8.9–10.3)
Chloride: 101 mmol/L (ref 98–111)
Creatinine, Ser: 1.05 mg/dL (ref 0.61–1.24)
GFR, Estimated: >60 mL/min (ref >60)
Glucose, Bld: 94 mg/dL (ref 70–99)
Potassium: 4.1 mmol/L (ref 3.5–5.1)
Sodium: 136 mmol/L (ref 135–145)
Total Bilirubin: 0.7 mg/dL (ref 0.3–1.2)
Total Protein: 7.4 g/dL (ref 6.5–8.1)

## 2020-10-22 LAB — URINALYSIS, ROUTINE W REFLEX MICROSCOPIC
Bilirubin Urine: NEGATIVE
Glucose, UA: NEGATIVE mg/dL
Hgb urine dipstick: NEGATIVE
Ketones, ur: 5 mg/dL — AB
Leukocytes,Ua: NEGATIVE
Nitrite: NEGATIVE
Protein, ur: NEGATIVE mg/dL
Specific Gravity, Urine: 1.021 (ref 1.005–1.030)
pH: 5 (ref 5.0–8.0)

## 2020-10-22 LAB — SURGICAL PCR SCREEN
MRSA, PCR: NEGATIVE
Staphylococcus aureus: NEGATIVE

## 2020-10-22 LAB — CBC WITH DIFFERENTIAL/PLATELET
Abs Immature Granulocytes: 0.04 10*3/uL (ref 0.00–0.07)
Basophils Absolute: 0.1 10*3/uL (ref 0.0–0.1)
Basophils Relative: 1 %
Eosinophils Absolute: 0.1 10*3/uL (ref 0.0–0.5)
Eosinophils Relative: 1 %
HCT: 46.3 % (ref 39.0–52.0)
Hemoglobin: 16.1 g/dL (ref 13.0–17.0)
Immature Granulocytes: 0 %
Lymphocytes Relative: 27 %
Lymphs Abs: 2.6 10*3/uL (ref 0.7–4.0)
MCH: 29.8 pg (ref 26.0–34.0)
MCHC: 34.8 g/dL (ref 30.0–36.0)
MCV: 85.6 fL (ref 80.0–100.0)
Monocytes Absolute: 0.7 10*3/uL (ref 0.1–1.0)
Monocytes Relative: 7 %
Neutro Abs: 6 10*3/uL (ref 1.7–7.7)
Neutrophils Relative %: 64 %
Platelets: 232 10*3/uL (ref 150–400)
RBC: 5.41 MIL/uL (ref 4.22–5.81)
RDW: 13.1 % (ref 11.5–15.5)
WBC: 9.5 10*3/uL (ref 4.0–10.5)
nRBC: 0 % (ref 0.0–0.2)

## 2020-10-22 NOTE — Patient Instructions (Addendum)
Your procedure is scheduled on: 11/03/20 Report to [place]. To find out your arrival time please call [phone] between 1PM - 3PM on 11/02/20.  Remember: Instructions that are not followed completely may result in serious medical risk, up to and including death, or upon the discretion of your surgeon and anesthesiologist your surgery may need to be rescheduled.     _X__ 1. Do not eat food after midnight the night before your procedure.                 No gum chewing or hard candies. You may drink clear liquids up to 2 hours                 before you are scheduled to arrive for your surgery- DO not any                 liquids within 2 hours of the start of your surgery.                 Clear Liquids include:  water, apple juice without pulp, clear carbohydrate                 drink such as Clearfast or Gatorade, Black Coffee or Tea (Do not add                 anything to coffee or tea). Diabetics water only  __X__2.  On the morning of surgery brush your teeth with toothpaste and water, you                 may rinse your mouth with mouthwash if you wish.  Do not swallow any              toothpaste of mouthwash.     _X__ 3.  No Alcohol for 24 hours before or after surgery.   _X__ 4.  Do Not Smoke or use e-cigarettes For 24 Hours Prior to Your Surgery.                 Do not use any chewable tobacco products for at least 6 hours prior to                 surgery.  ____  5.  Bring all medications with you on the day of surgery if instructed.   __X__  6.  Notify your doctor if there is any change in your medical condition      (cold, fever, infections).     Do not wear jewelry, make-up, hairpins, clips or nail polish. Do not wear lotions, powders, or perfumes.  Do not shave body hair 48 hours prior to surgery. Men may shave face and neck. Do not bring valuables to the hospital.    [facility] is not responsible for any belongings  or valuables.  Contacts, dentures/partials or body piercings may not be worn into surgery. Bring a case for your contacts, glasses or hearing aids, a denture cup will be supplied. Leave your suitcase in the car. After surgery it may be brought to your room. For patients admitted to the hospital, discharge time is determined by your treatment team.   Patients discharged the day of surgery will not be allowed to drive home.   Please read over the following fact sheets that you were given:   MRSA Information, CHG soap, Incentive Spirometer, Ensure  __X__ Take these medicines the morning of surgery with  A SIP OF WATER:    1. escitalopram (LEXAPRO) 10 MG tablet  2. levothyroxine (SYNTHROID, LEVOTHROID) 100 MCG tablet  3.   4.  5.  6.  ____ Fleet Enema (as directed)   __X__ Use CHG Soap/SAGE wipes as directed  ____ Use inhalers on the day of surgery  ____ Stop metformin/Janumet/Farxiga 2 days prior to surgery    ____ Take 1/2 of usual insulin dose the night before surgery. No insulin the morning          of surgery.   ____ Stop Blood Thinners Coumadin/Plavix/Xarelto/Pleta/Pradaxa/Eliquis/Effient/Aspirin  on   Or contact your Surgeon, Cardiologist or Medical Doctor regarding  ability to stop your blood thinners  __X__ Stop Anti-inflammatories 7 days before surgery such as Advil, Ibuprofen, Motrin,  BC or Goodies Powder, Naprosyn, Naproxen, Aleve, Aspirin    __X__ Stop all herbal and vitamin supplements, fish oil or vitamin E until after surgery.    ____ Bring C-Pap to the hospital.

## 2020-10-30 ENCOUNTER — Other Ambulatory Visit
Admission: RE | Admit: 2020-10-30 | Discharge: 2020-10-30 | Disposition: A | Discharge status: Home / Self Care | Payer: Medicare HMO | Source: Ambulatory Visit | Attending: Orthopedic Surgery | Admitting: Orthopedic Surgery

## 2020-10-30 ENCOUNTER — Other Ambulatory Visit: Payer: Self-pay

## 2020-10-30 DIAGNOSIS — Z20822 Contact with and (suspected) exposure to covid-19: Secondary | ICD-10-CM | POA: Diagnosis not present

## 2020-10-30 DIAGNOSIS — Z01812 Encounter for preprocedural laboratory examination: Secondary | ICD-10-CM | POA: Diagnosis not present

## 2020-10-30 LAB — SARS CORONAVIRUS 2 (TAT 6-24 HRS): SARS Coronavirus 2: NEGATIVE

## 2020-11-03 ENCOUNTER — Encounter
Admission: RE | Disposition: A | Discharge status: Home / Self Care | Payer: Self-pay | Source: Ambulatory Visit | Attending: Orthopedic Surgery

## 2020-11-03 ENCOUNTER — Observation Stay
Admission: RE | Admit: 2020-11-03 | Discharge: 2020-11-04 | Disposition: A | Discharge status: Home / Self Care | Payer: Medicare HMO | Source: Ambulatory Visit | Attending: Orthopedic Surgery | Admitting: Orthopedic Surgery

## 2020-11-03 ENCOUNTER — Ambulatory Visit: Payer: Medicare HMO | Admitting: Urgent Care

## 2020-11-03 ENCOUNTER — Encounter: Payer: Self-pay | Admitting: Orthopedic Surgery

## 2020-11-03 ENCOUNTER — Other Ambulatory Visit: Payer: Self-pay

## 2020-11-03 ENCOUNTER — Ambulatory Visit: Payer: Medicare HMO | Admitting: Anesthesiology

## 2020-11-03 ENCOUNTER — Observation Stay: Payer: Medicare HMO

## 2020-11-03 ENCOUNTER — Ambulatory Visit: Payer: Medicare HMO

## 2020-11-03 DIAGNOSIS — Z471 Aftercare following joint replacement surgery: Secondary | ICD-10-CM | POA: Diagnosis not present

## 2020-11-03 DIAGNOSIS — Z79899 Other long term (current) drug therapy: Secondary | ICD-10-CM | POA: Diagnosis not present

## 2020-11-03 DIAGNOSIS — G8918 Other acute postprocedural pain: Secondary | ICD-10-CM

## 2020-11-03 DIAGNOSIS — Z96642 Presence of left artificial hip joint: Secondary | ICD-10-CM | POA: Diagnosis not present

## 2020-11-03 DIAGNOSIS — M1612 Unilateral primary osteoarthritis, left hip: Principal | ICD-10-CM | POA: Insufficient documentation

## 2020-11-03 DIAGNOSIS — Z419 Encounter for procedure for purposes other than remedying health state, unspecified: Secondary | ICD-10-CM

## 2020-11-03 DIAGNOSIS — E039 Hypothyroidism, unspecified: Secondary | ICD-10-CM | POA: Insufficient documentation

## 2020-11-03 HISTORY — PX: TOTAL HIP ARTHROPLASTY: SHX124

## 2020-11-03 LAB — URINE DRUG SCREEN, QUALITATIVE (ARMC ONLY)
Amphetamines, Ur Screen: NOT DETECTED
Barbiturates, Ur Screen: NOT DETECTED
Benzodiazepine, Ur Scrn: NOT DETECTED
Cannabinoid 50 Ng, Ur ~~LOC~~: POSITIVE — AB
Cocaine Metabolite,Ur ~~LOC~~: NOT DETECTED
MDMA (Ecstasy)Ur Screen: NOT DETECTED
Methadone Scn, Ur: NOT DETECTED
Opiate, Ur Screen: POSITIVE — AB
Phencyclidine (PCP) Ur S: NOT DETECTED
Tricyclic, Ur Screen: NOT DETECTED

## 2020-11-03 LAB — CBC
HCT: 40.6 % (ref 39.0–52.0)
Hemoglobin: 14.1 g/dL (ref 13.0–17.0)
MCH: 29.9 pg (ref 26.0–34.0)
MCHC: 34.7 g/dL (ref 30.0–36.0)
MCV: 86.2 fL (ref 80.0–100.0)
Platelets: 180 10*3/uL (ref 150–400)
RBC: 4.71 MIL/uL (ref 4.22–5.81)
RDW: 12.9 % (ref 11.5–15.5)
WBC: 10.4 10*3/uL (ref 4.0–10.5)
nRBC: 0 % (ref 0.0–0.2)

## 2020-11-03 LAB — CREATININE, SERUM
Creatinine, Ser: 0.95 mg/dL (ref 0.61–1.24)
GFR, Estimated: >60 mL/min (ref >60)

## 2020-11-03 SURGERY — ARTHROPLASTY, HIP, TOTAL, ANTERIOR APPROACH
Anesthesia: Spinal | Site: Hip | Laterality: Left

## 2020-11-03 MED ORDER — EPHEDRINE SULFATE 50 MG/ML IJ SOLN
INTRAMUSCULAR | Status: DC | PRN
  Administered 2020-11-03: 10 mg via INTRAVENOUS

## 2020-11-03 MED ORDER — FAMOTIDINE 20 MG PO TABS
ORAL_TABLET | ORAL | Status: AC
  Administered 2020-11-03: 20 mg via ORAL
  Filled 2020-11-03: qty 1

## 2020-11-03 MED ORDER — MIDAZOLAM HCL 5 MG/5ML IJ SOLN
INTRAMUSCULAR | Status: DC | PRN
  Administered 2020-11-03: 2 mg via INTRAVENOUS

## 2020-11-03 MED ORDER — CHLORHEXIDINE GLUCONATE 0.12 % MT SOLN: 15.0000 mL | Freq: Once | OROMUCOSAL | Status: AC

## 2020-11-03 MED ORDER — SODIUM CHLORIDE 0.9 % IV SOLN: INTRAVENOUS | Status: DC

## 2020-11-03 MED ORDER — MIDAZOLAM HCL 2 MG/2ML IJ SOLN
INTRAMUSCULAR | Status: AC
  Filled 2020-11-03: qty 2

## 2020-11-03 MED ORDER — ALUM & MAG HYDROXIDE-SIMETH 200-200-20 MG/5ML PO SUSP: 30.0000 mL | ORAL | Status: DC | PRN

## 2020-11-03 MED ORDER — SODIUM CHLORIDE 0.9 % IR SOLN
Status: DC | PRN
  Administered 2020-11-03: 1004 mL

## 2020-11-03 MED ORDER — PHENOL 1.4 % MT LIQD
1.0000 | OROMUCOSAL | Status: DC | PRN
  Filled 2020-11-03: qty 177

## 2020-11-03 MED ORDER — HEMOSTATIC AGENTS (NO CHARGE) OPTIME
TOPICAL | Status: DC | PRN
  Administered 2020-11-03: 2 via TOPICAL

## 2020-11-03 MED ORDER — CEFAZOLIN SODIUM-DEXTROSE 2-4 GM/100ML-% IV SOLN
2.0000 g | Freq: Four times a day (QID) | INTRAVENOUS | Status: AC
Start: 2020-11-03 — End: 2020-11-03
  Administered 2020-11-03 (×2): 2 g via INTRAVENOUS
  Filled 2020-11-03 (×2): qty 100

## 2020-11-03 MED ORDER — PROPOFOL 500 MG/50ML IV EMUL
INTRAVENOUS | Status: DC | PRN
  Administered 2020-11-03: 200 ug/kg/min via INTRAVENOUS

## 2020-11-03 MED ORDER — NEOMYCIN-POLYMYXIN B GU 40-200000 IR SOLN
Status: AC
  Filled 2020-11-03: qty 4

## 2020-11-03 MED ORDER — ACETAMINOPHEN 325 MG PO TABS: 325.0000 mg | ORAL_TABLET | Freq: Four times a day (QID) | ORAL | Status: DC | PRN

## 2020-11-03 MED ORDER — CHLORHEXIDINE GLUCONATE 0.12 % MT SOLN
OROMUCOSAL | Status: AC
  Administered 2020-11-03: 15 mL via OROMUCOSAL
  Filled 2020-11-03: qty 15

## 2020-11-03 MED ORDER — SODIUM CHLORIDE FLUSH 0.9 % IV SOLN
INTRAVENOUS | Status: AC
  Filled 2020-11-03: qty 40

## 2020-11-03 MED ORDER — FLEET ENEMA 7-19 GM/118ML RE ENEM
1.0000 | ENEMA | Freq: Once | RECTAL | Status: DC | PRN
Start: 2020-11-03 — End: 2020-11-04

## 2020-11-03 MED ORDER — DIPHENHYDRAMINE HCL 12.5 MG/5ML PO ELIX: 12.5000 mg | ORAL_SOLUTION | ORAL | Status: DC | PRN

## 2020-11-03 MED ORDER — ZOLPIDEM TARTRATE 5 MG PO TABS: 5.0000 mg | ORAL_TABLET | Freq: Every evening | ORAL | Status: DC | PRN

## 2020-11-03 MED ORDER — PHENYLEPHRINE HCL (PRESSORS) 10 MG/ML IV SOLN
INTRAVENOUS | Status: AC
  Filled 2020-11-03: qty 2

## 2020-11-03 MED ORDER — SODIUM CHLORIDE (PF) 0.9 % IJ SOLN
INTRAMUSCULAR | Status: DC | PRN
  Administered 2020-11-03: 90 mL

## 2020-11-03 MED ORDER — METHOCARBAMOL 500 MG PO TABS: 500.0000 mg | ORAL_TABLET | Freq: Four times a day (QID) | ORAL | Status: DC | PRN

## 2020-11-03 MED ORDER — PROPOFOL 1000 MG/100ML IV EMUL
INTRAVENOUS | Status: AC
  Filled 2020-11-03: qty 100

## 2020-11-03 MED ORDER — ENOXAPARIN SODIUM 40 MG/0.4ML IJ SOSY
40.0000 mg | PREFILLED_SYRINGE | INTRAMUSCULAR | Status: DC
  Administered 2020-11-04: 40 mg via SUBCUTANEOUS
  Filled 2020-11-03: qty 0.4

## 2020-11-03 MED ORDER — TRAMADOL HCL 50 MG PO TABS
50.0000 mg | ORAL_TABLET | Freq: Four times a day (QID) | ORAL | Status: DC
Start: 2020-11-03 — End: 2020-11-04
  Administered 2020-11-03 – 2020-11-04 (×5): 50 mg via ORAL
  Filled 2020-11-03 (×5): qty 1

## 2020-11-03 MED ORDER — LEVOTHYROXINE SODIUM 100 MCG PO TABS
100.0000 ug | ORAL_TABLET | Freq: Every day | ORAL | Status: DC
  Administered 2020-11-04: 100 ug via ORAL
  Filled 2020-11-03: qty 2
  Filled 2020-11-03: qty 1

## 2020-11-03 MED ORDER — POLYETHYLENE GLYCOL 3350 17 G PO PACK: 17.0000 g | PACK | Freq: Every day | ORAL | Status: DC | PRN

## 2020-11-03 MED ORDER — SODIUM CHLORIDE 0.9 % IV SOLN
INTRAVENOUS | Status: DC | PRN
  Administered 2020-11-03: 50 ug/min via INTRAVENOUS

## 2020-11-03 MED ORDER — PANTOPRAZOLE SODIUM 40 MG PO TBEC
40.0000 mg | DELAYED_RELEASE_TABLET | Freq: Every day | ORAL | Status: DC
  Administered 2020-11-03 – 2020-11-04 (×2): 40 mg via ORAL
  Filled 2020-11-03 (×2): qty 1

## 2020-11-03 MED ORDER — ONDANSETRON HCL 4 MG PO TABS: 4.0000 mg | ORAL_TABLET | Freq: Four times a day (QID) | ORAL | Status: DC | PRN

## 2020-11-03 MED ORDER — MENTHOL 3 MG MT LOZG
1.0000 | LOZENGE | OROMUCOSAL | Status: DC | PRN
  Filled 2020-11-03: qty 9

## 2020-11-03 MED ORDER — MORPHINE SULFATE (PF) 2 MG/ML IV SOLN
0.5000 mg | INTRAVENOUS | Status: DC | PRN
  Administered 2020-11-03: 1 mg via INTRAVENOUS
  Filled 2020-11-03: qty 1

## 2020-11-03 MED ORDER — FENTANYL CITRATE (PF) 100 MCG/2ML IJ SOLN: 25.0000 ug | INTRAMUSCULAR | Status: DC | PRN

## 2020-11-03 MED ORDER — LACTATED RINGERS IV SOLN: INTRAVENOUS | Status: DC

## 2020-11-03 MED ORDER — ROSUVASTATIN CALCIUM 10 MG PO TABS
20.0000 mg | ORAL_TABLET | ORAL | Status: DC
  Administered 2020-11-03: 20 mg via ORAL
  Filled 2020-11-03: qty 2

## 2020-11-03 MED ORDER — ESCITALOPRAM OXALATE 10 MG PO TABS
10.0000 mg | ORAL_TABLET | Freq: Every morning | ORAL | Status: DC
  Administered 2020-11-04: 10 mg via ORAL
  Filled 2020-11-03: qty 1

## 2020-11-03 MED ORDER — BUPIVACAINE LIPOSOME 1.3 % IJ SUSP
INTRAMUSCULAR | Status: AC
  Filled 2020-11-03: qty 20

## 2020-11-03 MED ORDER — HYDROCODONE-ACETAMINOPHEN 5-325 MG PO TABS
ORAL_TABLET | ORAL | Status: AC
  Filled 2020-11-03: qty 1

## 2020-11-03 MED ORDER — BISACODYL 10 MG RE SUPP: 10.0000 mg | Freq: Every day | RECTAL | Status: DC | PRN

## 2020-11-03 MED ORDER — HYDROCODONE-ACETAMINOPHEN 7.5-325 MG PO TABS
1.0000 | ORAL_TABLET | ORAL | Status: DC | PRN
  Administered 2020-11-03 – 2020-11-04 (×6): 2 via ORAL
  Filled 2020-11-03 (×6): qty 2

## 2020-11-03 MED ORDER — METHOCARBAMOL 1000 MG/10ML IJ SOLN
500.0000 mg | Freq: Four times a day (QID) | INTRAVENOUS | Status: DC | PRN
  Filled 2020-11-03: qty 5

## 2020-11-03 MED ORDER — BUPIVACAINE-EPINEPHRINE (PF) 0.25% -1:200000 IJ SOLN
INTRAMUSCULAR | Status: AC
  Filled 2020-11-03: qty 30

## 2020-11-03 MED ORDER — BUPIVACAINE HCL (PF) 0.5 % IJ SOLN
INTRAMUSCULAR | Status: DC | PRN
  Administered 2020-11-03: 2.5 mL via INTRATHECAL

## 2020-11-03 MED ORDER — ROSUVASTATIN CALCIUM 10 MG PO TABS: 40.0000 mg | ORAL_TABLET | ORAL | Status: DC

## 2020-11-03 MED ORDER — DOCUSATE SODIUM 100 MG PO CAPS
100.0000 mg | ORAL_CAPSULE | Freq: Two times a day (BID) | ORAL | Status: DC
  Administered 2020-11-03 – 2020-11-04 (×3): 100 mg via ORAL
  Filled 2020-11-03 (×3): qty 1

## 2020-11-03 MED ORDER — ONDANSETRON HCL 4 MG/2ML IJ SOLN: 4.0000 mg | Freq: Once | INTRAMUSCULAR | Status: DC | PRN

## 2020-11-03 MED ORDER — ONDANSETRON HCL 4 MG/2ML IJ SOLN: 4.0000 mg | Freq: Four times a day (QID) | INTRAMUSCULAR | Status: DC | PRN

## 2020-11-03 MED ORDER — HYDROCODONE-ACETAMINOPHEN 5-325 MG PO TABS
1.0000 | ORAL_TABLET | ORAL | Status: DC | PRN
  Administered 2020-11-03: 1 via ORAL
  Filled 2020-11-03: qty 2

## 2020-11-03 MED ORDER — FAMOTIDINE 20 MG PO TABS: 20.0000 mg | ORAL_TABLET | Freq: Once | ORAL | Status: AC

## 2020-11-03 MED ORDER — ORAL CARE MOUTH RINSE
15.0000 mL | Freq: Once | OROMUCOSAL | Status: AC
Start: 2020-11-03 — End: 2020-11-03

## 2020-11-03 MED ORDER — CEFAZOLIN SODIUM-DEXTROSE 2-4 GM/100ML-% IV SOLN
2.0000 g | INTRAVENOUS | Status: AC
  Administered 2020-11-03: 2 g via INTRAVENOUS

## 2020-11-03 MED ORDER — PHENYLEPHRINE HCL (PRESSORS) 10 MG/ML IV SOLN
INTRAVENOUS | Status: DC | PRN
  Administered 2020-11-03: 100 ug via INTRAVENOUS
  Administered 2020-11-03: 50 ug via INTRAVENOUS

## 2020-11-03 MED ORDER — ROSUVASTATIN CALCIUM 20 MG PO TABS: 20.0000 mg | ORAL_TABLET | ORAL | Status: DC

## 2020-11-03 MED ORDER — CEFAZOLIN SODIUM-DEXTROSE 2-4 GM/100ML-% IV SOLN
INTRAVENOUS | Status: AC
  Filled 2020-11-03: qty 100

## 2020-11-03 SURGICAL SUPPLY — 61 items
BLADE SAGITTAL AGGR TOOTH XLG (BLADE) ×2 IMPLANT
BNDG COHESIVE 6X5 TAN ST LF (GAUZE/BANDAGES/DRESSINGS) ×6 IMPLANT
CANISTER WOUND CARE 500ML ATS (WOUND CARE) ×2 IMPLANT
CHLORAPREP W/TINT 26 (MISCELLANEOUS) ×2 IMPLANT
COVER BACK TABLE REUSABLE LG (DRAPES) ×2 IMPLANT
DRAPE 3/4 80X56 (DRAPES) ×6 IMPLANT
DRAPE C-ARM XRAY 36X54 (DRAPES) ×2 IMPLANT
DRAPE INCISE IOBAN 66X60 STRL (DRAPES) ×2 IMPLANT
DRAPE POUCH INSTRU U-SHP 10X18 (DRAPES) ×2 IMPLANT
DRESSING SURGICEL FIBRLLR 1X2 (HEMOSTASIS) ×2 IMPLANT
DRSG MEPILEX SACRM 8.7X9.8 (GAUZE/BANDAGES/DRESSINGS) ×2 IMPLANT
DRSG OPSITE POSTOP 4X8 (GAUZE/BANDAGES/DRESSINGS) ×4 IMPLANT
DRSG SURGICEL FIBRILLAR 1X2 (HEMOSTASIS) ×4
ELECT BLADE 6.5 EXT (BLADE) ×2 IMPLANT
ELECT REM PT RETURN 9FT ADLT (ELECTROSURGICAL) ×2
ELECTRODE REM PT RTRN 9FT ADLT (ELECTROSURGICAL) ×1 IMPLANT
GAUZE 4X4 16PLY ~~LOC~~+RFID DBL (SPONGE) ×2 IMPLANT
GLOVE SURG SYN 9.0  PF PI (GLOVE) ×2
GLOVE SURG SYN 9.0 PF PI (GLOVE) ×2 IMPLANT
GLOVE SURG UNDER POLY LF SZ9 (GLOVE) ×2 IMPLANT
GOWN SRG 2XL LVL 4 RGLN SLV (GOWNS) ×1 IMPLANT
GOWN STRL NON-REIN 2XL LVL4 (GOWNS) ×1
GOWN STRL REUS W/ TWL LRG LVL3 (GOWN DISPOSABLE) ×1 IMPLANT
GOWN STRL REUS W/TWL LRG LVL3 (GOWN DISPOSABLE) ×1
HEAD FEMORAL 28MM SZ M (Head) ×2 IMPLANT
HEMOVAC 400CC 10FR (MISCELLANEOUS) IMPLANT
HOLDER FOLEY CATH W/STRAP (MISCELLANEOUS) ×2 IMPLANT
IRRIGATION SURGIPHOR STRL (IV SOLUTION) IMPLANT
KIT PREVENA INCISION MGT 13 (CANNISTER) ×2 IMPLANT
LINER DML 28MM HIGHCROSS (Liner) ×2 IMPLANT
MANIFOLD NEPTUNE II (INSTRUMENTS) ×2 IMPLANT
MAT ABSORB  FLUID 56X50 GRAY (MISCELLANEOUS) ×1
MAT ABSORB FLUID 56X50 GRAY (MISCELLANEOUS) ×1 IMPLANT
NDL SAFETY ECLIPSE 18X1.5 (NEEDLE) ×1 IMPLANT
NEEDLE HYPO 18GX1.5 SHARP (NEEDLE) ×1
NEEDLE SPNL 20GX3.5 QUINCKE YW (NEEDLE) ×4 IMPLANT
NS IRRIG 1000ML POUR BTL (IV SOLUTION) ×2 IMPLANT
PACK HIP COMPR (MISCELLANEOUS) ×2 IMPLANT
SCALPEL PROTECTED #10 DISP (BLADE) ×4 IMPLANT
SHELL ACETABULAR DM  60MM (Shell) ×2 IMPLANT
SOL PREP PVP 2OZ (MISCELLANEOUS) ×2
SOLUTION PREP PVP 2OZ (MISCELLANEOUS) ×1 IMPLANT
SPONGE DRAIN TRACH 4X4 STRL 2S (GAUZE/BANDAGES/DRESSINGS) ×2 IMPLANT
SPONGE T-LAP 18X18 ~~LOC~~+RFID (SPONGE) ×4 IMPLANT
STAPLER SKIN PROX 35W (STAPLE) ×2 IMPLANT
STEM FEMORAL SZ5 STD COLLARED (Stem) ×2 IMPLANT
STRAP SAFETY 5IN WIDE (MISCELLANEOUS) ×2 IMPLANT
SUT DVC 2 QUILL PDO  T11 36X36 (SUTURE) ×1
SUT DVC 2 QUILL PDO T11 36X36 (SUTURE) ×1 IMPLANT
SUT SILK 0 (SUTURE) ×1
SUT SILK 0 30XBRD TIE 6 (SUTURE) ×1 IMPLANT
SUT V-LOC 90 ABS DVC 3-0 CL (SUTURE) ×2 IMPLANT
SUT VIC AB 1 CT1 36 (SUTURE) ×2 IMPLANT
SYR 20ML LL LF (SYRINGE) ×2 IMPLANT
SYR 30ML LL (SYRINGE) ×2 IMPLANT
SYR 50ML LL SCALE MARK (SYRINGE) ×4 IMPLANT
SYR BULB IRRIG 60ML STRL (SYRINGE) ×2 IMPLANT
TAPE MICROFOAM 4IN (TAPE) ×2 IMPLANT
TOWEL OR 17X26 4PK STRL BLUE (TOWEL DISPOSABLE) ×2 IMPLANT
TRAY FOLEY MTR SLVR 16FR STAT (SET/KITS/TRAYS/PACK) ×2 IMPLANT
WATER STERILE IRR 500ML POUR (IV SOLUTION) ×2 IMPLANT

## 2020-11-03 NOTE — Transfer of Care (Signed)
Immediate Anesthesia Transfer of Care Note  Patient: Dillon Singh  Procedure(s) Performed: TOTAL HIP ARTHROPLASTY ANTERIOR APPROACH (Left: Hip)  Patient Location: PACU  Anesthesia Type:Spinal  Level of Consciousness: sedated  Airway & Oxygen Therapy: Patient Spontanous Breathing and Patient connected to face mask oxygen  Post-op Assessment: Report given to RN and Post -op Vital signs reviewed and stable  Post vital signs: Reviewed and stable  Last Vitals:  Vitals Value Taken Time  BP    Temp    Pulse    Resp    SpO2      Last Pain:  Vitals:   11/03/20 0623  TempSrc: Tympanic  PainSc: 0-No pain         Complications: No notable events documented.

## 2020-11-03 NOTE — Evaluation (Signed)
Physical Therapy Evaluation Patient Details Name: Dillon Singh MRN: [id number] DOB: 1960-10-20 Today's Date: 11/03/2020  History of Present Illness  Patient is a 60 year old male with osteoarthritis of left hip and elected to have left total hip arthroplasty. Past medical history of arthritis, fibromyalgia, anxiety, depression, chronic back pain with previous discectomy.   Clinical Impression  Patient agreeable to PT. Patient reports he was independent with ambulation without assistive device prior to surgery.   Patient reports 7/10 pain in left hip at rest and had been pre-medicated prior to evaluation.  Patient was agreeable to mobilize. Patient was able to get out of bed and ambulate a short distance in the room with rolling walker with Min guard assistance. No loss of balance or knee buckling with weight bearing. Overall activity tolerance is limited by pain, however patient does report pain mildly improved after standing and ambulating. Patient is hopeful to discharge home tomorrow. Recommend to continue PT to maximize independence and facilitate return to prior level of function. Anticipate patient will be able to go home with family assistance at discharge.      Recommendations for follow up therapy are one component of a multi-disciplinary discharge planning process, led by the attending physician.  Recommendations may be updated based on patient status, additional functional criteria and insurance authorization.  Follow Up Recommendations Home health PT    Equipment Recommendations  3in1 (PT)    Recommendations for Other Services       Precautions / Restrictions Precautions Precautions: Fall;Anterior Hip Precaution Booklet Issued: Yes (comment) Restrictions Weight Bearing Restrictions: Yes LLE Weight Bearing: Weight bearing as tolerated      Mobility  Bed Mobility Overal bed mobility: Modified Independent Bed Mobility: Supine to Sit     Supine to sit: Modified  independent (Device/Increase time)     General bed mobility comments: extra time and effort required. no physical assistance required for bed mobility    Transfers Overall transfer level: Needs assistance Equipment used: Rolling walker (2 wheeled) Transfers: Sit to/from Stand Sit to Stand: Min guard         General transfer comment: Min guard for safety. verbal cues for hand placement and technique for safety  Ambulation/Gait Ambulation/Gait assistance: Min guard Gait Distance (Feet): 3 Feet Assistive device: Rolling walker (2 wheeled) Gait Pattern/deviations: Step-to pattern;Antalgic Gait velocity: decreased   General Gait Details: verbal cues for safety and technique. further standing activity tolerance limited by fatigue  Stairs            Wheelchair Mobility    Modified Rankin (Stroke Patients Only)       Balance Overall balance assessment: Needs assistance Sitting-balance support: Feet supported Sitting balance-Leahy Scale: Good     Standing balance support: Bilateral upper extremity supported Standing balance-Leahy Scale: Fair Standing balance comment: patient relying on rolling walker for UE support in standing                             Pertinent Vitals/Pain Pain Assessment: 0-10 Pain Score: 7  Pain Location: left hip Pain Descriptors / Indicators: Sore Pain Intervention(s): Monitored during session (patient reports pain decreased after mobilizing)    Home Living Family/patient expects to be discharged to:: Private residence Living Arrangements: Spouse/significant other Available Help at Discharge: Family Type of Home: House Home Access:  (one step up)     Home Layout: One level Home Equipment: Walker - 2 wheels Additional Comments: patient is the caregiver  for his parents that live with him    Prior Function Level of Independence: Independent               Hand Dominance        Extremity/Trunk Assessment    Upper Extremity Assessment Upper Extremity Assessment: Overall WFL for tasks assessed    Lower Extremity Assessment Lower Extremity Assessment: LLE deficits/detail;RLE deficits/detail RLE Deficits / Details: WFL for tasks assessed RLE Sensation: WNL LLE Deficits / Details: patient able to activate hip/ knee/ankle movement. patient able to stand without knee buckling LLE Sensation: WNL       Communication   Communication: No difficulties  Cognition Arousal/Alertness: Awake/alert Behavior During Therapy: WFL for tasks assessed/performed Overall Cognitive Status: Within Functional Limits for tasks assessed                                 General Comments: patient able to follow all commands without difficulty      General Comments      Exercises Total Joint Exercises Quad Sets: AROM;Strengthening;Left;5 reps;Supine   Assessment/Plan    PT Assessment Patient needs continued PT services  PT Problem List Decreased strength;Decreased range of motion;Decreased activity tolerance;Decreased balance;Decreased mobility;Pain;Decreased safety awareness;Decreased knowledge of use of DME       PT Treatment Interventions DME instruction;Gait training;Stair training;Functional mobility training;Therapeutic activities;Therapeutic exercise;Balance training;Neuromuscular re-education;Patient/family education    PT Goals (Current goals can be found in the Care Plan section)  Acute Rehab PT Goals Patient Stated Goal: to go home tomorrow PT Goal Formulation: With patient Time For Goal Achievement: 11/17/20 Potential to Achieve Goals: Good    Frequency BID   Barriers to discharge        Co-evaluation               AM-PAC PT "6 Clicks" Mobility  Outcome Measure Help needed turning from your back to your side while in a flat bed without using bedrails?: None Help needed moving from lying on your back to sitting on the side of a flat bed without using bedrails?:  None Help needed moving to and from a bed to a chair (including a wheelchair)?: A Little Help needed standing up from a chair using your arms (e.g., wheelchair or bedside chair)?: A Little Help needed to walk in hospital room?: A Little Help needed climbing 3-5 steps with a railing? : A Little 6 Click Score: 20    End of Session   Activity Tolerance: Patient tolerated treatment well Patient left: in chair;with call bell/phone within reach;with chair alarm set;with SCD's reapplied (patient requesting to remain in the chair at end of session.) Nurse Communication: Mobility status PT Visit Diagnosis: Pain;Other abnormalities of gait and mobility (R26.89) Pain - Right/Left: Left Pain - part of body: Hip    Time: [id number] PT Time Calculation (min) (ACUTE ONLY): 29 min   Charges:   PT Evaluation $PT Eval Moderate Complexity: 1 Mod PT Treatments $Therapeutic Activity: 8-22 mins        Minna Merritts, PT, MPT  Percell Locus 11/03/2020, 3:01 PM

## 2020-11-03 NOTE — Progress Notes (Signed)
Pt complaint of extreme pressure in urethra and bladder area.  Deflated bulb in foley and tried to readjust however, unsuccessful.  Removed foley and pt felt relief.  Noticed some bloody discharge.  Awaiting for pt to urinate on his own.  Bladder scan read 8m after foley removed.  Will continue to monitor

## 2020-11-03 NOTE — H&P (Signed)
Chief Complaint  Patient presents with   Left Hip - Pain    History of the Present Illness: Dillon Singh is a 60 y.o. male here today.   The patient presents for evaluation of left hip pain. He has had an MRI that shows more significant osteoarthritis with significant knee anterior and posterior cartilage wear than x-ray showed, which showed mild to moderate degenerative change. He has had a trochanteric injection and hip injection with only short term relief. He comes in today to discuss possible total hip arthroplasty on the left.  The patient locates his pain to the left buttock. He states he had 2 good days of relief from his left hip joint injection.   The patient states he has nerve damage to his back. He had a discectomy, and did fine for 10 years, until the past 2-3 years. He has some right hip pain as well.  The patient is not currently working. He has no history of blood clots.  I have reviewed past medical, surgical, social and family history, and allergies as documented in the EMR.  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   Past Family History: Family History  Problem Relation Age of Onset   Diabetes Mother   Heart disease Father   Medications: Current Outpatient Medications Ordered in Epic  Medication Sig Dispense Refill   celecoxib (CELEBREX) 100 MG capsule Take 1 capsule (100 mg total) by mouth 2 (two) times daily 60 capsule 5    escitalopram oxalate (LEXAPRO) 10 MG tablet Take 10 mg by mouth once daily   gabapentin (NEURONTIN) 300 MG capsule Take 300 mg by mouth 2 (two) times daily   levothyroxine (SYNTHROID) 100 MCG tablet Take 100 mcg by mouth once daily   rosuvastatin (CRESTOR) 20 MG tablet Take 20 mg by mouth once daily   No current Epic-ordered facility-administered medications on file.   Allergies: No Known Allergies   Body mass index is 23.59 kg/m.  Review of Systems: A comprehensive 14 point ROS was performed, reviewed, and the pertinent orthopaedic findings are documented in the HPI.  Vitals:  10/07/20 0834  BP: 108/66    General Physical Examination:   General/Constitutional: No apparent distress: well-nourished and well developed. Eyes: Pupils equal, round with synchronous movement. Lungs:  Clear to auscultation HEENT:  Normal Vascular: No edema, swelling or tenderness, except as noted in detailed exam. Cardiac: Heart rate and rhythm is regular. Integumentary: No impressive skin lesions present, except as noted in detailed exam. Neuro/Psych: Normal mood and affect, oriented to person, place and time.  Musculoskeletal Examination:  On exam, left hip internal rotation is 30 degrees with pain, external is 40 degrees. No flexion contracture.   Radiographs:  No new imaging studies were obtained today.  Assessment: ICD-10-CM  1. Primary osteoarthritis of both hips M16.0   Plan:  The patient has clinical findings of left hip osteoarthritis.  We discussed the patient's  prior x-ray findings. I explained he has more significant osteoarthritis with significant anterior and posterior cartilage wear. I explained we would like to wait 3 months between injections in the joint and surgery due to the risk of infection. I explained the surgery and postoperative course in detail.  We will schedule the patient for left total hip arthroplasty in the near future.  Surgical Risks:  The nature of  the condition and the proposed procedure has been reviewed in detail with the patient. Surgical versus non-surgical options and prognosis for recovery have been reviewed and the inherent risks and benefits of each have been discussed including the risks of infection, bleeding, injury to nerves/blood vessels/tendons, incomplete relief of symptoms, persisting pain and/or stiffness, loss of function, complex regional pain syndrome, failure of the procedure, as appropriate.  Teeth: Upper denture and missing several teeth in lower.  Attestation: I, Dawn Royse, am documenting for [employer], MD utilizing Cyril.    Electronically signed by Lauris Poag, MD at 10/08/2020 7:41 PM EDT  Reviewed  H+P. No changes noted.

## 2020-11-03 NOTE — Op Note (Signed)
11/03/2020  8:52 AM  PATIENT:  Dillon Singh  60 y.o. male  PRE-OPERATIVE DIAGNOSIS:  Primary osteoarthritis of left hip  M16.12  POST-OPERATIVE DIAGNOSIS:  Primary osteoarthritis of left hip  M16.12  PROCEDURE:  Procedure(s): TOTAL HIP ARTHROPLASTY ANTERIOR APPROACH (Left)  SURGEON: Laurene Footman, MD  ASSISTANTS: none  ANESTHESIA:   spinal  EBL:  Total I/O In: 100 [IV Piggyback:100] Out: 500 [Urine:300; Blood:200]  BLOOD ADMINISTERED:none  DRAINS:  Incisional wound VAC    LOCAL MEDICATIONS USED:  MARCAINE    and OTHER Exparel  SPECIMEN: Left femoral head  DISPOSITION OF SPECIMEN:  PATHOLOGY  COUNTS:  YES  TOURNIQUET:  * No tourniquets in log *  IMPLANTS: Medacta 5 standard AMIS stem with 60 mm Mpact DM cup and liner with ceramic M 28 mm head  DICTATION: .Dragon Dictation   The patient was brought to the operating room and after spinal anesthesia was obtained patient was placed on the operative table with the ipsilateral foot into the Medacta attachment, contralateral leg on a well-padded table. C-arm was brought in and preop template x-ray taken. After prepping and draping in usual sterile fashion appropriate patient identification and timeout procedures were completed. Anterior approach to the hip was obtained and centered over the greater trochanter and TFL muscle. The subcutaneous tissue was incised hemostasis being achieved by electrocautery. TFL fascia was incised and the muscle retracted laterally deep retractor placed. The lateral femoral circumflex vessels were identified and ligated. The anterior capsule was exposed and a capsulotomy performed. The neck was identified and a femoral neck cut carried out with a saw. The head was removed without difficulty and showed posterior femoral head where and acetabulum showing significant degenerative changes centrally. Reaming was carried out to 60 mm and a 60 mm cup trial gave appropriate tightness to the acetabular component  a 60 DM cup was impacted into position. The leg was then externally rotated and ischiofemoral and pubofemoral releases carried out. The femur was sequentially broached to a size 5, size 5 standard with S then M trials were placed and the final components chosen. The 5 standard stem was inserted along with a ceramic M 28 mm head and 60 mm liner. The hip was reduced and was stable the wound was thoroughly irrigated with fibrillar placed along the posterior capsule and medial neck. The deep fascia ws closed using a heavy Quill after infiltration of 30 cc of quarter percent Sensorcaine with epinephrine diluted with Exparel throughout the case .3-0 V-loc to close the skin with skin staples.  Incisional VAC applied and patient was sent to recovery in stable condition.   PLAN OF CARE: Admit for overnight observation

## 2020-11-03 NOTE — Anesthesia Preprocedure Evaluation (Signed)
Anesthesia Evaluation  Patient identified by MRN, date of birth, ID band Patient awake    Reviewed: Allergy & Precautions, NPO status , Patient's Chart, lab work & pertinent test results  History of Anesthesia Complications Negative for: history of anesthetic complications  Airway Mallampati: II  TM Distance: >3 FB Neck ROM: Full    Dental  (+) Edentulous Upper, Missing, Poor Dentition   Pulmonary neg sleep apnea, neg COPD, Current Smoker and Patient abstained from smoking.,    breath sounds clear to auscultation- rhonchi (-) wheezing      Cardiovascular Exercise Tolerance: Good (-) hypertension(-) CAD, (-) Past MI, (-) Cardiac Stents and (-) CABG  Rhythm:Regular Rate:Normal - Systolic murmurs and - Diastolic murmurs    Neuro/Psych neg Seizures PSYCHIATRIC DISORDERS Anxiety Depression negative neurological ROS     GI/Hepatic Neg liver ROS, GERD  ,  Endo/Other  neg diabetesHypothyroidism   Renal/GU negative Renal ROS     Musculoskeletal  (+) Arthritis , Fibromyalgia -  Abdominal (+) - obese,   Peds  Hematology negative hematology ROS (+)   Anesthesia Other Findings Past Medical History: No date: Arthritis No date: Back pain No date: Erectile dysfunction No date: Fibromyalgia No date: GERD (gastroesophageal reflux disease) No date: Hip pain No date: Hyperlipidemia No date: Hypopituitarism ([place]) No date: Hypothyroidism   Reproductive/Obstetrics                             Lab Results  Component Value Date   WBC 9.5 10/22/2020   HGB 16.1 10/22/2020   HCT 46.3 10/22/2020   MCV 85.6 10/22/2020   PLT 232 10/22/2020    Anesthesia Physical Anesthesia Plan  ASA: 2  Anesthesia Plan: Spinal   Post-op Pain Management:    Induction:   PONV Risk Score and Plan: 0 and Propofol infusion  Airway Management Planned: Natural Airway  Additional Equipment:   Intra-op Plan:    Post-operative Plan:   Informed Consent: I have reviewed the patients History and Physical, chart, labs and discussed the procedure including the risks, benefits and alternatives for the proposed anesthesia with the patient or authorized representative who has indicated his/her understanding and acceptance.     Dental advisory given  Plan Discussed with: CRNA and Anesthesiologist  Anesthesia Plan Comments:         Anesthesia Quick Evaluation

## 2020-11-03 NOTE — Anesthesia Procedure Notes (Signed)
Spinal  Patient location during procedure: OR Start time: 11/03/2020 7:19 AM End time: 11/03/2020 7:22 AM Reason for block: surgical anesthesia Staffing Performed: resident/CRNA  Anesthesiologist: Emmie Niemann, MD Resident/CRNA: Nelda Marseille, CRNA Preanesthetic Checklist Completed: patient identified, IV checked, site marked, risks and benefits discussed, surgical consent, monitors and equipment checked, pre-op evaluation and timeout performed Spinal Block Patient position: sitting Prep: ChloraPrep Patient monitoring: heart rate, continuous pulse ox and blood pressure Approach: midline Location: L4-5 Injection technique: single-shot Needle Needle type: Introducer and Pencil-Tip  Needle gauge: 24 G Needle length: 9 cm Assessment Events: CSF return Additional Notes Negative paresthesia. Negative blood return. Positive free-flowing CSF. Expiration date of kit checked and confirmed. Patient tolerated procedure well, without complications.

## 2020-11-04 DIAGNOSIS — E039 Hypothyroidism, unspecified: Secondary | ICD-10-CM | POA: Diagnosis not present

## 2020-11-04 DIAGNOSIS — Z96642 Presence of left artificial hip joint: Secondary | ICD-10-CM | POA: Diagnosis not present

## 2020-11-04 DIAGNOSIS — M1612 Unilateral primary osteoarthritis, left hip: Secondary | ICD-10-CM | POA: Diagnosis not present

## 2020-11-04 DIAGNOSIS — Z79899 Other long term (current) drug therapy: Secondary | ICD-10-CM | POA: Diagnosis not present

## 2020-11-04 LAB — CBC
HCT: 36 % — ABNORMAL LOW (ref 39.0–52.0)
Hemoglobin: 12.3 g/dL — ABNORMAL LOW (ref 13.0–17.0)
MCH: 29.1 pg (ref 26.0–34.0)
MCHC: 34.2 g/dL (ref 30.0–36.0)
MCV: 85.1 fL (ref 80.0–100.0)
Platelets: 165 10*3/uL (ref 150–400)
RBC: 4.23 MIL/uL (ref 4.22–5.81)
RDW: 13 % (ref 11.5–15.5)
WBC: 8.9 10*3/uL (ref 4.0–10.5)
nRBC: 0 % (ref 0.0–0.2)

## 2020-11-04 LAB — BASIC METABOLIC PANEL
Anion gap: 5 (ref 5–15)
BUN: 13 mg/dL (ref 8–23)
CO2: 26 mmol/L (ref 22–32)
Calcium: 7.8 mg/dL — ABNORMAL LOW (ref 8.9–10.3)
Chloride: 104 mmol/L (ref 98–111)
Creatinine, Ser: 0.9 mg/dL (ref 0.61–1.24)
GFR, Estimated: >60 mL/min (ref >60)
Glucose, Bld: 105 mg/dL — ABNORMAL HIGH (ref 70–99)
Potassium: 3.6 mmol/L (ref 3.5–5.1)
Sodium: 135 mmol/L (ref 135–145)

## 2020-11-04 LAB — SURGICAL PATHOLOGY

## 2020-11-04 MED ORDER — ENOXAPARIN SODIUM 40 MG/0.4ML IJ SOSY: 40.0000 mg | PREFILLED_SYRINGE | INTRAMUSCULAR | 0 refills | Status: DC

## 2020-11-04 MED ORDER — TRAMADOL HCL 50 MG PO TABS: 50.0000 mg | ORAL_TABLET | Freq: Four times a day (QID) | ORAL | 0 refills | Status: DC

## 2020-11-04 MED ORDER — METHOCARBAMOL 500 MG PO TABS: 500.0000 mg | ORAL_TABLET | Freq: Four times a day (QID) | ORAL | 0 refills | Status: DC | PRN

## 2020-11-04 MED ORDER — HYDROCODONE-ACETAMINOPHEN 7.5-325 MG PO TABS: 1.0000 | ORAL_TABLET | ORAL | 0 refills | Status: DC | PRN

## 2020-11-04 MED ORDER — DOCUSATE SODIUM 100 MG PO CAPS: 100.0000 mg | ORAL_CAPSULE | Freq: Two times a day (BID) | ORAL | 0 refills | Status: DC

## 2020-11-04 NOTE — Progress Notes (Signed)
Pt discharged to home.  IV removed without complication.  AVS and prescriptions given to pt and explained with no further questions.  Wound vac swithched to [facility]ena portable vac.  All belongings at bedside taken with pt.  Pt transported off unit via WC.

## 2020-11-04 NOTE — Discharge Summary (Signed)
Physician Discharge Summary  Patient ID: Dillon Singh MRN: [id number] DOB/AGE: 03/10/1960 60 y.o.  Admit date: 11/03/2020 Discharge date: 11/04/2020  Admission Diagnoses:  Status post total hip replacement, left [Z96.642]   Discharge Diagnoses: Patient Active Problem List   Diagnosis Date Noted   Status post total hip replacement, left 11/03/2020   Lumbar back pain 08/01/2013   Viral syndrome 03/21/2013   Anxiety 11/17/2011   General medical examination 03/30/2011   Skin lesion 09/13/2010   HYPERLIPIDEMIA 08/12/2009   ERECTILE DYSFUNCTION 08/15/2008   PAIN IN JOINT, MULTIPLE SITES 08/15/2008   DEPRESSIVE DISORDER 07/17/2008   FIBROMYALGIA 07/17/2008   RASH-NONVESICULAR 07/17/2008   INSOMNIA-SLEEP DISORDER-UNSPEC 06/17/2008   TOBACCO USE 04/03/2008   NECK MASS 03/11/2008   ACUTE LYMPHADENITIS 02/26/2008   HYPOTHYROIDISM 02/04/2008   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   BACK PAIN, CHRONIC 02/04/2008   DIZZINESS 02/04/2008    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([place])    Hypothyroidism      Transfusion: none   Consultants (if any):   Discharged Condition: Improved  Hospital Course: Dillon Singh is an 60 y.o. male who was admitted 11/03/2020 with a diagnosis of left hip osteoarthritis and went to the operating room on 11/03/2020 and underwent the above named procedures.    Surgeries: Procedure(s): TOTAL HIP ARTHROPLASTY ANTERIOR APPROACH on 11/03/2020 Patient tolerated the surgery well. Taken to PACU where she was stabilized and then transferred to the orthopedic floor.  Started on Lovenox 40 mg q 24 hrs. Foot pumps applied bilaterally at 80 mm. Heels elevated on bed with rolled towels. No evidence of DVT. Negative Homan. Physical therapy started on day #1 for gait training and transfer. OT  started day #1 for ADL and assisted devices.  Patient's foley was d/c on day #1. Patient's IV was d/c on day #1.  On post op day #1 patient was stable and ready for discharge to home with HHPT.  Implants: Medacta 5 standard AMIS stem with 60 mm Mpact DM cup and liner with ceramic M 28 mm head  He was given perioperative antibiotics:  Anti-infectives (From admission, onward)    Start     Dose/Rate Route Frequency Ordered Stop   11/03/20 1300  ceFAZolin (ANCEF) IVPB 2g/100 mL premix        2 g 200 mL/hr over 30 Minutes Intravenous Every 6 hours 11/03/20 0952 11/03/20 1850   11/03/20 0615  ceFAZolin (ANCEF) IVPB 2g/100 mL premix        2 g 200 mL/hr over 30 Minutes Intravenous On call to O.R. 11/03/20 [id number] 11/03/20 0736   11/03/20 0610  ceFAZolin (ANCEF) 2-4 GM/100ML-% IVPB       Note to Pharmacy: [place] [facility], [facility]: cabinet override      11/03/20 0610 11/03/20 0729     .  He was given sequential compression devices, early ambulation, and Lovenox TEDs for DVT prophylaxis.  He benefited maximally from the hospital stay and there were no complications.    Recent vital signs:  Vitals:   11/04/20 0453 11/04/20 0754  BP: 102/63 109/63  Pulse: 67 67  Resp: 19 16  Temp: 98.1 F (36.7 C) 97.7 F (36.5 C)  SpO2: 98% 98%    Recent laboratory studies:  Lab Results  Component Value  Date   HGB 12.3 (L) 11/04/2020   HGB 14.1 11/03/2020   HGB 16.1 10/22/2020   Lab Results  Component Value Date   WBC 8.9 11/04/2020   PLT 165 11/04/2020   Lab Results  Component Value Date   INR 0.92 05/09/2016   Lab Results  Component Value Date   NA 135 11/04/2020   K 3.6 11/04/2020   CL 104 11/04/2020   CO2 26 11/04/2020   BUN 13 11/04/2020   CREATININE 0.90 11/04/2020   GLUCOSE 105 (H) 11/04/2020    Discharge Medications:   Allergies as of 11/04/2020   No Known Allergies      Medication List     TAKE these medications    celecoxib 100 MG capsule Commonly known as:  CELEBREX Take 100 mg by mouth in the morning and at bedtime.   docusate sodium 100 MG capsule Commonly known as: COLACE Take 1 capsule (100 mg total) by mouth 2 (two) times daily.   enoxaparin 40 MG/0.4ML injection Commonly known as: LOVENOX Inject 0.4 mLs (40 mg total) into the skin daily for 14 days.   escitalopram 10 MG tablet Commonly known as: LEXAPRO Take 10 mg by mouth in the morning.   HYDROcodone-acetaminophen 7.5-325 MG tablet Commonly known as: NORCO Take 1-2 tablets by mouth every 4 (four) hours as needed for severe pain (pain score 7-10).   levothyroxine 100 MCG tablet Commonly known as: SYNTHROID Take 100 mcg by mouth daily before breakfast.   methocarbamol 500 MG tablet Commonly known as: ROBAXIN Take 1 tablet (500 mg total) by mouth every 6 (six) hours as needed for muscle spasms.   rosuvastatin 20 MG tablet Commonly known as: CRESTOR Take 20-40 mg by mouth See admin instructions. Take 2 tablets (40 mg) by mouth on Mondays & Thursdays in the evening. Take 1 tablet (20 mg) by mouth on 'Sundays, Tuesdays, Wednesdays, Fridays & Saturdays in the evening.   traMADol 50 MG tablet Commonly known as: ULTRAM Take 1 tablet (50 mg total) by mouth every 6 (six) hours.               Durable Medical Equipment  (From admission, onward)           Start     Ordered   11/03/20 0952  DME Walker rolling  Once       Question Answer Comment  Walker: With 5 Inch Wheels   Patient needs a walker to treat with the following condition Status post total hip replacement, left      11/03/20 0952   11/03/20 0952  DME 3 n 1  Once        09'$ /13/22 0952   11/03/20 0952  DME Bedside commode  Once       Question:  Patient needs a bedside commode to treat with the following condition  Answer:  Status post total hip replacement, left   11/03/20 0952            Diagnostic Studies: DG HIP OPERATIVE UNILAT W OR W/O PELVIS LEFT  Result Date: 11/03/2020 CLINICAL DATA:  Left  hip arthroplasty EXAM: OPERATIVE left HIP (WITH PELVIS IF PERFORMED) 4 VIEWS TECHNIQUE: Fluoroscopic spot image(s) were submitted for interpretation post-operatively. COMPARISON:  None. FINDINGS: Intraoperative images during left hip arthroplasty. Hardware alignment is normal without evidence of periprosthetic fracture. IMPRESSION: Intraoperative images during left hip arthroplasty. No evidence of immediate hardware complication. Electronically Signed   By: Maurine Simmering M.D.   On: 11/03/2020 09:46  DG HIP UNILAT W OR W/O PELVIS 2-3 VIEWS LEFT  Result Date: 11/03/2020 CLINICAL DATA:  Left hip replacement EXAM: DG HIP (WITH OR WITHOUT PELVIS) 2-3V LEFT COMPARISON:  Left hip MRI 07/29/2020 FINDINGS: There is a left hip arthroplasty normal alignment without evidence of loosening or fracture. Expected soft tissue changes. IMPRESSION: Left hip arthroplasty without evidence of immediate hardware complication. Electronically Signed   By: Maurine Simmering M.D.   On: 11/03/2020 09:45    Disposition:      Follow-up Information     Duanne Guess, PA-C Follow up in 2 week(s).   Specialties: Orthopedic Surgery, Emergency Medicine Contact information: [place] [address] [phone]                  Signed: Feliberto Gottron 11/04/2020, 8:14 AM

## 2020-11-04 NOTE — Plan of Care (Signed)
  Problem: Education: Goal: Knowledge of General Education information will improve Description: Including pain rating scale, medication(s)/side effects and non-pharmacologic comfort measures Outcome: Progressing   Problem: Health Behavior/Discharge Planning: Goal: Ability to manage health-related needs will improve Outcome: Progressing   Problem: Activity: Goal: Risk for activity intolerance will decrease Outcome: Progressing   Problem: Pain Managment: Goal: General experience of comfort will improve Outcome: Progressing   Problem: Safety: Goal: Ability to remain free from injury will improve Outcome: Progressing   Problem: Activity: Goal: Ability to tolerate increased activity will improve Outcome: Progressing

## 2020-11-04 NOTE — Progress Notes (Signed)
Met with the patient at the bedside to discuss DC plan and needs He lives at home with his spouse and is caregivers for his parents, he has a rolling walker at home and a raised toilet seat, he needs a 3 in 1, [employer] will bring to the room prior to DC He is set up with [facility] for [facility] services He has transportation and can afford his medication

## 2020-11-04 NOTE — Anesthesia Postprocedure Evaluation (Signed)
Anesthesia Post Note  Patient: Dillon Singh  Procedure(s) Performed: TOTAL HIP ARTHROPLASTY ANTERIOR APPROACH (Left: Hip)  Patient location during evaluation: Nursing Unit Anesthesia Type: Spinal Level of consciousness: oriented and awake and alert Pain management: pain level controlled Vital Signs Assessment: post-procedure vital signs reviewed and stable Respiratory status: spontaneous breathing and respiratory function stable Cardiovascular status: blood pressure returned to baseline and stable Postop Assessment: no headache, no backache, no apparent nausea or vomiting and patient able to bend at knees Anesthetic complications: no   No notable events documented.   Last Vitals:  Vitals:   11/04/20 0453 11/04/20 0754  BP: 102/63 109/63  Pulse: 67 67  Resp: 19 16  Temp: 36.7 C 36.5 C  SpO2: 98% 98%    Last Pain:  Vitals:   11/04/20 0718  TempSrc:   PainSc: [place]

## 2020-11-04 NOTE — Discharge Instructions (Signed)

## 2020-11-04 NOTE — Progress Notes (Signed)
Physical Therapy Treatment Patient Details Name: Dillon Singh MRN: [id number] DOB: 1960-07-17 Today's Date: 11/04/2020   History of Present Illness Patient is a 60 year old male with osteoarthritis of left hip and elected to have left total hip arthroplasty. Past medical history of arthritis, fibromyalgia, anxiety, depression, chronic back pain with previous discectomy.    PT Comments    Pt was long sitting in bed upon arriving. He is A and O x 4 and agreeable to session. Pt is motivated and wanting to DC home this date. He was easily able to exit bed, stand, and ambulate 200 ft with RW without difficulty or safety concern. Performed ascending/descending 1 step with RW to simulate home environment. Pt is cleared from an acute PT standpoint for safe DC home with HHPT to follow.    Recommendations for follow up therapy are one component of a multi-disciplinary discharge planning process, led by the attending physician.  Recommendations may be updated based on patient status, additional functional criteria and insurance authorization.  Follow Up Recommendations  Home health PT     Equipment Recommendations  None recommended by PT (pt has all equipment needs at home)       Precautions / Restrictions Precautions Precautions: Fall;Anterior Hip Precaution Booklet Issued: Yes (comment) Restrictions Weight Bearing Restrictions: Yes LLE Weight Bearing: Weight bearing as tolerated     Mobility  Bed Mobility Overal bed mobility: Modified Independent             General bed mobility comments: HOB elevated but pt was able to perform without physical assistance    Transfers Overall transfer level: Needs assistance Equipment used: Rolling walker (2 wheeled) Transfers: Sit to/from Stand Sit to Stand: Supervision         General transfer comment: Pt was easily able to STS from EOB and from recliner without difficulty or safety concern  Ambulation/Gait Ambulation/Gait assistance:  Supervision Gait Distance (Feet): 200 Feet Assistive device: Rolling walker (2 wheeled) Gait Pattern/deviations: Step-through pattern;Step-to pattern;Antalgic Gait velocity: decreased   General Gait Details: antalgic step to pattern however was able to tolerate and advance to step through with vcs only   Stairs Stairs: Yes Stairs assistance: Supervision Stair Management: No rails;With walker Number of Stairs: 1 General stair comments: pt was easily and safely able to ascend/descend 1 step without physical assistance.    Balance Overall balance assessment: Modified Independent         Standing balance support: Bilateral upper extremity supported Standing balance-Leahy Scale: Good Standing balance comment: pt has good balance with use of RW during dynamic activity         Cognition Arousal/Alertness: Awake/alert Behavior During Therapy: WFL for tasks assessed/performed Overall Cognitive Status: Within Functional Limits for tasks assessed        General Comments: Pt is A and O x 4             Pertinent Vitals/Pain Pain Assessment: 0-10 Pain Score: 8  Pain Location: left hip Pain Descriptors / Indicators: Sore Pain Intervention(s): Limited activity within patient's tolerance;Monitored during session;Premedicated before session;Repositioned;Ice applied     PT Goals (current goals can now be found in the care plan section) Acute Rehab PT Goals Patient Stated Goal: go home today Progress towards PT goals: Progressing toward goals    Frequency    BID      PT Plan Current plan remains appropriate       AM-PAC PT "6 Clicks" Mobility   Outcome Measure  Help needed turning  from your back to your side while in a flat bed without using bedrails?: None Help needed moving from lying on your back to sitting on the side of a flat bed without using bedrails?: None Help needed moving to and from a bed to a chair (including a wheelchair)?: A Little Help needed  standing up from a chair using your arms (e.g., wheelchair or bedside chair)?: A Little Help needed to walk in hospital room?: A Little Help needed climbing 3-5 steps with a railing? : A Little 6 Click Score: 20    End of Session Equipment Utilized During Treatment: Gait belt Activity Tolerance: Patient tolerated treatment well Patient left: in chair;with call bell/phone within reach;with chair alarm set Nurse Communication: Mobility status PT Visit Diagnosis: Pain;Other abnormalities of gait and mobility (R26.89) Pain - Right/Left: Left Pain - part of body: Hip     Time: [id number] PT Time Calculation (min) (ACUTE ONLY): 33 min  Charges:  $Gait Training: 8-22 mins $Therapeutic Exercise: 8-22 mins                     Julaine Fusi PTA 11/04/20, 9:32 AM

## 2020-11-04 NOTE — Evaluation (Signed)
Occupational Therapy Evaluation Patient Details Name: Dillon Singh MRN: [id number] DOB: 03-Mar-1960 Today's Date: 11/04/2020   History of Present Illness Patient is a 60 year old male with osteoarthritis of left hip and elected to have left total hip arthroplasty. Past medical history of arthritis, fibromyalgia, anxiety, depression, chronic back pain with previous discectomy.   Clinical Impression   Upon entering the room, pt seated in recliner chair and agreeable to OT intervention. Pt lives with his wife and they are caregivers for his parents. Pt completely independent prior to admission. OT educated pt on LB self care and educated pt on decreasing fall risks within home. Pt asking appropriate questions and performing mobility tasks with supervision and use of RW. Pt with no further OT needs at this time. Pt is agreeable. OT to SIGN OFF.      Recommendations for follow up therapy are one component of a multi-disciplinary discharge planning process, led by the attending physician.  Recommendations may be updated based on patient status, additional functional criteria and insurance authorization.   Follow Up Recommendations  No OT follow up;Supervision - Intermittent    Equipment Recommendations  3 in 1 bedside commode       Precautions / Restrictions Precautions Precautions: Fall;Anterior Hip Precaution Booklet Issued: Yes (comment) Restrictions Weight Bearing Restrictions: Yes LLE Weight Bearing: Weight bearing as tolerated      Mobility Bed Mobility Overal bed mobility: Modified Independent             General bed mobility comments: seated in recliner chair    Transfers Overall transfer level: Needs assistance Equipment used: Rolling walker (2 wheeled) Transfers: Sit to/from Stand Sit to Stand: Supervision         General transfer comment: Pt was easily able to STS from EOB and from recliner without difficulty or safety concern    Balance Overall balance  assessment: Modified Independent Sitting-balance support: Feet supported Sitting balance-Leahy Scale: Good     Standing balance support: Bilateral upper extremity supported Standing balance-Leahy Scale: Good Standing balance comment: pt has good balance with use of RW during dynamic activity                           ADL either performed or assessed with clinical judgement   ADL Overall ADL's : Needs assistance/impaired                                       General ADL Comments: min A for LB self care and supervision for functional mobility and transfers.     Vision Patient Visual Report: No change from baseline              Pertinent Vitals/Pain Pain Assessment: 0-10 Pain Score: 5  Pain Location: left hip Pain Descriptors / Indicators: Sore Pain Intervention(s): Limited activity within patient's tolerance;Monitored during session;Premedicated before session;Repositioned     Hand Dominance Right   Extremity/Trunk Assessment Upper Extremity Assessment Upper Extremity Assessment: Overall WFL for tasks assessed   Lower Extremity Assessment Lower Extremity Assessment: Defer to PT evaluation       Communication Communication Communication: No difficulties   Cognition Arousal/Alertness: Awake/alert Behavior During Therapy: WFL for tasks assessed/performed Overall Cognitive Status: Within Functional Limits for tasks assessed  General Comments: Pt is A and O x 4. pleasant and cooperative.   General Comments       Exercises Exercises: Total Joint (issued HEP and pt demonstrates understanding)   Shoulder Instructions      Home Living Family/patient expects to be discharged to:: Private residence Living Arrangements: Spouse/significant other Available Help at Discharge: Family;Available 24 hours/day Type of Home: House Home Access: Other (comment) (one step up)     Home Layout: One  level     Bathroom Shower/Tub: Tub/shower unit         Home Equipment: Environmental consultant - 2 wheels   Additional Comments: patient is the caregiver for his parents that live with him      Prior Functioning/Environment Level of Independence: Independent                          OT Goals(Current goals can be found in the care plan section) Acute Rehab OT Goals Patient Stated Goal: go home today OT Goal Formulation: With patient Time For Goal Achievement: 11/18/20 Potential to Achieve Goals: Good   AM-PAC OT "6 Clicks" Daily Activity     Outcome Measure Help from another person eating meals?: None Help from another person taking care of personal grooming?: None Help from another person toileting, which includes using toliet, bedpan, or urinal?: A Little Help from another person bathing (including washing, rinsing, drying)?: A Little Help from another person to put on and taking off regular upper body clothing?: None Help from another person to put on and taking off regular lower body clothing?: A Little 6 Click Score: 21   End of Session    Activity Tolerance: Patient tolerated treatment well Patient left: in chair;with call bell/phone within reach;with chair alarm set                   Time: [id number] OT Time Calculation (min): 17 min Charges:  OT General Charges $OT Visit: 1 Visit OT Evaluation $OT Eval Low Complexity: 1 Low OT Treatments $Self Care/Home Management : 8-22 mins  Darleen Crocker, MS, OTR/L , CBIS ascom [phone]  11/04/20, 12:04 PM

## 2020-11-04 NOTE — Progress Notes (Signed)
   Subjective: 1 Day Post-Op Procedure(s) (LRB): TOTAL HIP ARTHROPLASTY ANTERIOR APPROACH (Left) Patient reports pain as mild.   Patient is well, and has had no acute complaints or problems Denies any CP, SOB, ABD pain. We will continue therapy today.  Plan is to go Home after hospital stay.  Objective: Vital signs in last 24 hours: Temp:  [97.3 F (36.3 C)-98.1 F (36.7 C)] 97.7 F (36.5 C) (09/14 0754) Pulse Rate:  [57-67] 67 (09/14 0754) Resp:  [11-23] 16 (09/14 0754) BP: (100-125)/(53-79) 109/63 (09/14 0754) SpO2:  [96 %-100 %] 98 % (09/14 0754)  Intake/Output from previous day: 09/13 0701 - 09/14 0700 In: 2225.4 [P.O.:480; I.V.:1645.4; IV Piggyback:100] Out: 2150 [Urine:1950; Blood:200] Intake/Output this shift: No intake/output data recorded.  Recent Labs    11/03/20 1054 11/04/20 0545  HGB 14.1 12.3*   Recent Labs    11/03/20 1054 11/04/20 0545  WBC 10.4 8.9  RBC 4.71 4.23  HCT 40.6 36.0*  PLT 180 165   Recent Labs    11/03/20 1054 11/04/20 0545  NA  --  135  K  --  3.6  CL  --  104  CO2  --  26  BUN  --  13  CREATININE 0.95 0.90  GLUCOSE  --  105*  CALCIUM  --  7.8*   No results for input(s): LABPT, INR in the last 72 hours.  EXAM General - Patient is Alert, Appropriate, and Oriented Extremity - Neurovascular intact Sensation intact distally Intact pulses distally Dorsiflexion/Plantar flexion intact No cellulitis present Compartment soft Dressing - dressing C/D/I and no drainage, prevena intact with out drainage Motor Function - intact, moving foot and toes well on exam.   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([facility])    Hypothyroidism     Assessment/Plan:   1 Day Post-Op Procedure(s) (LRB): TOTAL HIP ARTHROPLASTY ANTERIOR APPROACH (Left) Active Problems:   Status post  total hip replacement, left  Estimated body mass index is 23.09 kg/m as calculated from the following:   Height as of this encounter: '5\' 10"'$  (1.778 m).   Weight as of this encounter: 73 kg. Advance diet Up with therapy VSS Pain well controlled Labs stable CM to assist with discharge to home with HHPT. Possible dc to home today if patient able to complete PT goals  DVT Prophylaxis - Lovenox and TED hose, SCDs Weight-Bearing as tolerated to Left leg   T. Rachelle Hora, PA-C [place] 11/04/2020, 8:10 AM

## 2020-11-07 DIAGNOSIS — E785 Hyperlipidemia, unspecified: Secondary | ICD-10-CM | POA: Diagnosis not present

## 2020-11-07 DIAGNOSIS — N529 Male erectile dysfunction, unspecified: Secondary | ICD-10-CM | POA: Diagnosis not present

## 2020-11-07 DIAGNOSIS — K219 Gastro-esophageal reflux disease without esophagitis: Secondary | ICD-10-CM | POA: Diagnosis not present

## 2020-11-07 DIAGNOSIS — M797 Fibromyalgia: Secondary | ICD-10-CM | POA: Diagnosis not present

## 2020-11-07 DIAGNOSIS — E230 Hypopituitarism: Secondary | ICD-10-CM | POA: Diagnosis not present

## 2020-11-07 DIAGNOSIS — M1611 Unilateral primary osteoarthritis, right hip: Secondary | ICD-10-CM | POA: Diagnosis not present

## 2020-11-07 DIAGNOSIS — Z471 Aftercare following joint replacement surgery: Secondary | ICD-10-CM | POA: Diagnosis not present

## 2020-11-07 DIAGNOSIS — E039 Hypothyroidism, unspecified: Secondary | ICD-10-CM | POA: Diagnosis not present

## 2020-11-07 DIAGNOSIS — K5900 Constipation, unspecified: Secondary | ICD-10-CM | POA: Diagnosis not present

## 2020-11-10 DIAGNOSIS — K219 Gastro-esophageal reflux disease without esophagitis: Secondary | ICD-10-CM | POA: Diagnosis not present

## 2020-11-10 DIAGNOSIS — E230 Hypopituitarism: Secondary | ICD-10-CM | POA: Diagnosis not present

## 2020-11-10 DIAGNOSIS — E039 Hypothyroidism, unspecified: Secondary | ICD-10-CM | POA: Diagnosis not present

## 2020-11-10 DIAGNOSIS — N529 Male erectile dysfunction, unspecified: Secondary | ICD-10-CM | POA: Diagnosis not present

## 2020-11-10 DIAGNOSIS — K5900 Constipation, unspecified: Secondary | ICD-10-CM | POA: Diagnosis not present

## 2020-11-10 DIAGNOSIS — M1611 Unilateral primary osteoarthritis, right hip: Secondary | ICD-10-CM | POA: Diagnosis not present

## 2020-11-10 DIAGNOSIS — Z471 Aftercare following joint replacement surgery: Secondary | ICD-10-CM | POA: Diagnosis not present

## 2020-11-10 DIAGNOSIS — E785 Hyperlipidemia, unspecified: Secondary | ICD-10-CM | POA: Diagnosis not present

## 2020-11-10 DIAGNOSIS — M797 Fibromyalgia: Secondary | ICD-10-CM | POA: Diagnosis not present

## 2020-11-11 DIAGNOSIS — E230 Hypopituitarism: Secondary | ICD-10-CM | POA: Diagnosis not present

## 2020-11-11 DIAGNOSIS — Z471 Aftercare following joint replacement surgery: Secondary | ICD-10-CM | POA: Diagnosis not present

## 2020-11-11 DIAGNOSIS — K219 Gastro-esophageal reflux disease without esophagitis: Secondary | ICD-10-CM | POA: Diagnosis not present

## 2020-11-11 DIAGNOSIS — E039 Hypothyroidism, unspecified: Secondary | ICD-10-CM | POA: Diagnosis not present

## 2020-11-11 DIAGNOSIS — N529 Male erectile dysfunction, unspecified: Secondary | ICD-10-CM | POA: Diagnosis not present

## 2020-11-11 DIAGNOSIS — M797 Fibromyalgia: Secondary | ICD-10-CM | POA: Diagnosis not present

## 2020-11-11 DIAGNOSIS — K5900 Constipation, unspecified: Secondary | ICD-10-CM | POA: Diagnosis not present

## 2020-11-11 DIAGNOSIS — E785 Hyperlipidemia, unspecified: Secondary | ICD-10-CM | POA: Diagnosis not present

## 2020-11-11 DIAGNOSIS — M1611 Unilateral primary osteoarthritis, right hip: Secondary | ICD-10-CM | POA: Diagnosis not present

## 2020-11-13 DIAGNOSIS — E785 Hyperlipidemia, unspecified: Secondary | ICD-10-CM | POA: Diagnosis not present

## 2020-11-13 DIAGNOSIS — K219 Gastro-esophageal reflux disease without esophagitis: Secondary | ICD-10-CM | POA: Diagnosis not present

## 2020-11-13 DIAGNOSIS — E039 Hypothyroidism, unspecified: Secondary | ICD-10-CM | POA: Diagnosis not present

## 2020-11-13 DIAGNOSIS — M797 Fibromyalgia: Secondary | ICD-10-CM | POA: Diagnosis not present

## 2020-11-13 DIAGNOSIS — Z471 Aftercare following joint replacement surgery: Secondary | ICD-10-CM | POA: Diagnosis not present

## 2020-11-13 DIAGNOSIS — K5900 Constipation, unspecified: Secondary | ICD-10-CM | POA: Diagnosis not present

## 2020-11-13 DIAGNOSIS — N529 Male erectile dysfunction, unspecified: Secondary | ICD-10-CM | POA: Diagnosis not present

## 2020-11-13 DIAGNOSIS — E230 Hypopituitarism: Secondary | ICD-10-CM | POA: Diagnosis not present

## 2020-11-13 DIAGNOSIS — M1611 Unilateral primary osteoarthritis, right hip: Secondary | ICD-10-CM | POA: Diagnosis not present

## 2020-11-16 DIAGNOSIS — E039 Hypothyroidism, unspecified: Secondary | ICD-10-CM | POA: Diagnosis not present

## 2020-11-16 DIAGNOSIS — K5900 Constipation, unspecified: Secondary | ICD-10-CM | POA: Diagnosis not present

## 2020-11-16 DIAGNOSIS — M1611 Unilateral primary osteoarthritis, right hip: Secondary | ICD-10-CM | POA: Diagnosis not present

## 2020-11-16 DIAGNOSIS — E230 Hypopituitarism: Secondary | ICD-10-CM | POA: Diagnosis not present

## 2020-11-16 DIAGNOSIS — N529 Male erectile dysfunction, unspecified: Secondary | ICD-10-CM | POA: Diagnosis not present

## 2020-11-16 DIAGNOSIS — M797 Fibromyalgia: Secondary | ICD-10-CM | POA: Diagnosis not present

## 2020-11-16 DIAGNOSIS — Z471 Aftercare following joint replacement surgery: Secondary | ICD-10-CM | POA: Diagnosis not present

## 2020-11-16 DIAGNOSIS — E785 Hyperlipidemia, unspecified: Secondary | ICD-10-CM | POA: Diagnosis not present

## 2020-11-16 DIAGNOSIS — K219 Gastro-esophageal reflux disease without esophagitis: Secondary | ICD-10-CM | POA: Diagnosis not present

## 2020-11-18 DIAGNOSIS — M797 Fibromyalgia: Secondary | ICD-10-CM | POA: Diagnosis not present

## 2020-11-18 DIAGNOSIS — Z471 Aftercare following joint replacement surgery: Secondary | ICD-10-CM | POA: Diagnosis not present

## 2020-11-18 DIAGNOSIS — E230 Hypopituitarism: Secondary | ICD-10-CM | POA: Diagnosis not present

## 2020-11-18 DIAGNOSIS — E039 Hypothyroidism, unspecified: Secondary | ICD-10-CM | POA: Diagnosis not present

## 2020-11-18 DIAGNOSIS — E785 Hyperlipidemia, unspecified: Secondary | ICD-10-CM | POA: Diagnosis not present

## 2020-11-18 DIAGNOSIS — K219 Gastro-esophageal reflux disease without esophagitis: Secondary | ICD-10-CM | POA: Diagnosis not present

## 2020-11-18 DIAGNOSIS — K5900 Constipation, unspecified: Secondary | ICD-10-CM | POA: Diagnosis not present

## 2020-11-18 DIAGNOSIS — N529 Male erectile dysfunction, unspecified: Secondary | ICD-10-CM | POA: Diagnosis not present

## 2020-11-18 DIAGNOSIS — M1611 Unilateral primary osteoarthritis, right hip: Secondary | ICD-10-CM | POA: Diagnosis not present

## 2020-11-30 DIAGNOSIS — M19041 Primary osteoarthritis, right hand: Secondary | ICD-10-CM | POA: Diagnosis not present

## 2020-11-30 DIAGNOSIS — R768 Other specified abnormal immunological findings in serum: Secondary | ICD-10-CM | POA: Diagnosis not present

## 2020-11-30 DIAGNOSIS — M19042 Primary osteoarthritis, left hand: Secondary | ICD-10-CM | POA: Diagnosis not present

## 2020-12-16 DIAGNOSIS — Z96642 Presence of left artificial hip joint: Secondary | ICD-10-CM | POA: Diagnosis not present

## 2020-12-21 DIAGNOSIS — N182 Chronic kidney disease, stage 2 (mild): Secondary | ICD-10-CM | POA: Diagnosis not present

## 2020-12-21 DIAGNOSIS — I700 Atherosclerosis of aorta: Secondary | ICD-10-CM | POA: Diagnosis not present

## 2020-12-21 DIAGNOSIS — E039 Hypothyroidism, unspecified: Secondary | ICD-10-CM | POA: Diagnosis not present

## 2020-12-21 DIAGNOSIS — E782 Mixed hyperlipidemia: Secondary | ICD-10-CM | POA: Diagnosis not present

## 2020-12-28 DIAGNOSIS — E039 Hypothyroidism, unspecified: Secondary | ICD-10-CM | POA: Diagnosis not present

## 2020-12-28 DIAGNOSIS — N182 Chronic kidney disease, stage 2 (mild): Secondary | ICD-10-CM | POA: Diagnosis not present

## 2020-12-28 DIAGNOSIS — E782 Mixed hyperlipidemia: Secondary | ICD-10-CM | POA: Diagnosis not present

## 2020-12-28 DIAGNOSIS — I700 Atherosclerosis of aorta: Secondary | ICD-10-CM | POA: Diagnosis not present

## 2021-01-13 DIAGNOSIS — Z0100 Encounter for examination of eyes and vision without abnormal findings: Secondary | ICD-10-CM | POA: Diagnosis not present

## 2021-01-13 DIAGNOSIS — H43813 Vitreous degeneration, bilateral: Secondary | ICD-10-CM | POA: Diagnosis not present

## 2021-01-20 DIAGNOSIS — E782 Mixed hyperlipidemia: Secondary | ICD-10-CM | POA: Diagnosis not present

## 2021-01-20 DIAGNOSIS — I700 Atherosclerosis of aorta: Secondary | ICD-10-CM | POA: Diagnosis not present

## 2021-01-20 DIAGNOSIS — E039 Hypothyroidism, unspecified: Secondary | ICD-10-CM | POA: Diagnosis not present

## 2021-01-20 DIAGNOSIS — N182 Chronic kidney disease, stage 2 (mild): Secondary | ICD-10-CM | POA: Diagnosis not present

## 2021-02-09 ENCOUNTER — Encounter: Payer: Self-pay | Admitting: Emergency Medicine

## 2021-02-09 ENCOUNTER — Ambulatory Visit
Admission: EM | Admit: 2021-02-09 | Discharge: 2021-02-09 | Disposition: A | Discharge status: Home / Self Care | Payer: Medicare HMO | Attending: Family Medicine | Admitting: Family Medicine

## 2021-02-09 DIAGNOSIS — L03115 Cellulitis of right lower limb: Secondary | ICD-10-CM | POA: Diagnosis not present

## 2021-02-09 DIAGNOSIS — L309 Dermatitis, unspecified: Secondary | ICD-10-CM | POA: Diagnosis not present

## 2021-02-09 MED ORDER — DOXYCYCLINE HYCLATE 100 MG PO CAPS: 100.0000 mg | ORAL_CAPSULE | Freq: Two times a day (BID) | ORAL | 0 refills | Status: AC

## 2021-02-09 MED ORDER — TRIAMCINOLONE ACETONIDE 0.1 % EX CREA: 1.0000 "application " | TOPICAL_CREAM | Freq: Two times a day (BID) | CUTANEOUS | 0 refills | Status: DC | PRN

## 2021-02-09 NOTE — ED Provider Notes (Signed)
Dillon Singh    CSN: [id number] Arrival date & time: 02/09/21  2426      History   Chief Complaint Chief Complaint  Patient presents with   Rash    HPI Dillon Singh is a 60 y.o. male.   HPI Patient presents today for evaluation of a rash involving the right shin x 1 month. Patient describes the rash as pruritic and has expanded to the lower right shin area.  He has no similar type rash on any other part of his body.  His lower shin has begun to swell over the last few days and he has had some scant drainage from a localized aspect of the rash.  No recent illness.  No history of diabetes. 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   Lumbar back pain 08/01/2013   Viral syndrome 03/21/2013   Anxiety 11/17/2011   General medical examination 03/30/2011   Skin lesion 09/13/2010   HYPERLIPIDEMIA 08/12/2009   ERECTILE DYSFUNCTION 08/15/2008   PAIN IN JOINT, MULTIPLE SITES 08/15/2008   DEPRESSIVE DISORDER 07/17/2008   FIBROMYALGIA 07/17/2008   RASH-NONVESICULAR 07/17/2008   INSOMNIA-SLEEP DISORDER-UNSPEC 06/17/2008   TOBACCO USE 04/03/2008   NECK MASS 03/11/2008   ACUTE LYMPHADENITIS 02/26/2008   HYPOTHYROIDISM 02/04/2008   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   BACK PAIN, CHRONIC 02/04/2008   DIZZINESS 02/04/2008    Past Surgical History:  Procedure Laterality Date   APPENDECTOMY     LUMBAR LAMINECTOMY/DECOMPRESSION MICRODISCECTOMY Left 05/11/2016   Procedure: LUMBAR LAMINECTOMY/DECOMPRESSION MICRODISCECTOMY L2-3;  Surgeon: Meade Maw, MD;  Location: [facility] ORS;  Service: Neurosurgery;  Laterality: Left;   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 Authorizing Provider  doxycycline (VIBRAMYCIN) 100 MG capsule Take 1 capsule (100 mg total) by mouth 2 (two) times daily for 7 days. 02/09/21 02/16/21 Yes Scot Jun, FNP  triamcinolone cream (KENALOG) 0.1 % Apply 1 application topically 2 (two) times daily as needed. 02/09/21  Yes Scot Jun, FNP  celecoxib (CELEBREX) 100 MG capsule Take 100 mg by mouth in the morning and at bedtime. 08/21/20   [provider]  docusate sodium (COLACE) 100 MG capsule Take 1 capsule (100 mg total) by mouth 2 (two) times daily. 11/04/20   Duanne Guess, PA-C  enoxaparin (LOVENOX) 40 MG/0.4ML injection Inject 0.4 mLs (40 mg total) into the skin daily for 14 days. 11/04/20 11/18/20  Duanne Guess, PA-C  escitalopram (LEXAPRO) 10 MG tablet Take 10 mg by mouth in the morning. 10/22/18   [provider]  HYDROcodone-acetaminophen (NORCO) 7.5-325 MG tablet Take 1-2 tablets by mouth every 4 (four) hours as needed for severe pain (pain score 7-10). 11/04/20   Duanne Guess, PA-C  levothyroxine (SYNTHROID, LEVOTHROID) 100 MCG tablet Take 100 mcg by mouth daily before breakfast.  [provider]  methocarbamol (ROBAXIN) 500 MG tablet Take 1 tablet (500 mg total) by mouth every 6 (six) hours as needed for muscle spasms. 11/04/20   Duanne Guess, PA-C  rosuvastatin (CRESTOR) 20 MG tablet Take 20-40 mg by mouth See admin instructions. Take 2 tablets (40 mg) by mouth on Mondays & Thursdays in the evening. Take 1 tablet (20 mg) by mouth on Sundays, Tuesdays, Wednesdays, Fridays & Saturdays in the evening. 08/03/20   [provider]  traMADol (ULTRAM) 50 MG tablet Take 1 tablet (50 mg total) by  mouth every 6 (six) hours. 11/04/20   Duanne Guess, PA-C    Family History Family History  Problem Relation Age of Onset   Diabetes Mother     Social History Social History   Tobacco Use   Smoking status: Every Day    Packs/day: 1.00    Types: Cigarettes   Smokeless tobacco: Never  Vaping Use   Vaping Use: Never used  Substance Use Topics   Alcohol use: No   Drug use: Yes    Types: Marijuana    Comment: occ     Allergies   Patient has no known allergies.   Review of Systems Review of Systems Pertinent negatives listed in HPI   Physical Exam Triage Vital Signs ED Triage Vitals  Enc Vitals Group     BP 02/09/21 0830 116/75     Pulse Rate 02/09/21 0830 66     Resp 02/09/21 0830 16     Temp 02/09/21 0830 97.9 F (36.6 C)     Temp Source 02/09/21 0830 Oral     SpO2 02/09/21 0830 98 %     Weight --      Height --      Head Circumference --      Peak Flow --      Pain Score 02/09/21 0828 0     Pain Loc --      Pain Edu? --      Excl. in [place]? --    No data found.  Updated Vital Signs BP 116/75 (BP Location: Left Arm)    Pulse 66    Temp 97.9 F (36.6 C) (Oral)    Resp 16    SpO2 98%   Visual Acuity Right Eye Distance:   Left Eye Distance:   Bilateral Distance:    Right Eye Near:   Left Eye Near:    Bilateral Near:     Physical Exam Constitutional:      Appearance: Normal appearance.  HENT:     Head: Normocephalic and atraumatic.  Cardiovascular:     Rate and Rhythm: Normal rate.  Pulmonary:     Effort: Pulmonary effort is normal.  Skin:    General: Skin is warm.       Neurological:     Mental Status: He is alert.     GCS: GCS eye subscore is 4. GCS verbal subscore is 5. GCS motor subscore is 6.     Motor: Motor function is intact.     Coordination: Coordination is intact.     UC Treatments / Results  Labs (all labs ordered are listed, but only abnormal results are displayed) Labs Reviewed - No data to  display  EKG   Radiology No results found.  Procedures Procedures (including critical care time)  Medications Ordered in UC Medications - No data to display  Initial Impression / Assessment and Plan / UC Course  I have reviewed the  triage vital signs and the nursing notes.  Pertinent labs & imaging results that were available during my care of the patient were reviewed by me and considered in my medical decision making (see chart for details).     Dermatitis of unknown etiology with a secondary cellulitis infection involving the right lower extremity.  Treatment today with doxycycline twice daily for 7 days for cellulitis infection.  Triamcinolone cream twice daily as needed to the rash.  Strict return precautions given if symptoms worsen or do not readily improve. Final Clinical Impressions(s) / UC Diagnoses   Final diagnoses:  Dermatitis  Cellulitis of leg, right   Discharge Instructions   None    ED Prescriptions     Medication Sig Dispense Auth. Provider   doxycycline (VIBRAMYCIN) 100 MG capsule Take 1 capsule (100 mg total) by mouth 2 (two) times daily for 7 days. 14 capsule Scot Jun, FNP   triamcinolone cream (KENALOG) 0.1 % Apply 1 application topically 2 (two) times daily as needed. 454 g Scot Jun, FNP      PDMP not reviewed this encounter.   Scot Jun, [place] 02/09/21 [phone]

## 2021-02-09 NOTE — ED Triage Notes (Signed)
Pt presents with a rash on right shin x 1 month.

## 2021-03-23 DIAGNOSIS — E039 Hypothyroidism, unspecified: Secondary | ICD-10-CM | POA: Diagnosis not present

## 2021-03-23 DIAGNOSIS — I700 Atherosclerosis of aorta: Secondary | ICD-10-CM | POA: Diagnosis not present

## 2021-03-23 DIAGNOSIS — E782 Mixed hyperlipidemia: Secondary | ICD-10-CM | POA: Diagnosis not present

## 2021-03-23 DIAGNOSIS — N182 Chronic kidney disease, stage 2 (mild): Secondary | ICD-10-CM | POA: Diagnosis not present

## 2021-03-30 ENCOUNTER — Encounter: Payer: Self-pay | Admitting: Emergency Medicine

## 2021-03-30 ENCOUNTER — Other Ambulatory Visit: Payer: Self-pay

## 2021-03-30 ENCOUNTER — Ambulatory Visit
Admission: EM | Admit: 2021-03-30 | Discharge: 2021-03-30 | Disposition: A | Discharge status: Home / Self Care | Payer: Medicare HMO | Attending: Emergency Medicine | Admitting: Emergency Medicine

## 2021-03-30 DIAGNOSIS — J209 Acute bronchitis, unspecified: Secondary | ICD-10-CM | POA: Diagnosis not present

## 2021-03-30 MED ORDER — AZITHROMYCIN 250 MG PO TABS: 250.0000 mg | ORAL_TABLET | Freq: Every day | ORAL | 0 refills | Status: DC

## 2021-03-30 NOTE — Discharge Instructions (Addendum)
Take the Zithromax as directed.  Follow up with your primary care provider for a recheck of your lungs in 2-3 days.

## 2021-03-30 NOTE — ED Triage Notes (Signed)
Pt here with chest congestion x 10 days.

## 2021-03-30 NOTE — ED Provider Notes (Signed)
Dillon Singh    CSN: [id number] Arrival date & time: 03/30/21  0849      History   Chief Complaint Chief Complaint  Patient presents with   Chest congestion    HPI Dillon Singh is a 61 y.o. male.  Patient presents with a 10-day history of chest congestion and nonproductive cough.  He denies fever, chills, ear pain, sore throat, vomiting, diarrhea, or other symptoms.  Treatment attempted at home with OTC cough medication.  Current everyday smoker.   The history is provided by the patient and medical records.   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[facility])    Hypothyroidism     Patient Active Problem List   Diagnosis Date Noted   Status post total hip replacement, left 11/03/2020   Lumbar back pain 08/01/2013   Viral syndrome 03/21/2013   Anxiety 11/17/2011   General medical examination 03/30/2011   Skin lesion 09/13/2010   HYPERLIPIDEMIA 08/12/2009   ERECTILE DYSFUNCTION 08/15/2008   PAIN IN JOINT, MULTIPLE SITES 08/15/2008   DEPRESSIVE DISORDER 07/17/2008   FIBROMYALGIA 07/17/2008   RASH-NONVESICULAR 07/17/2008   INSOMNIA-SLEEP DISORDER-UNSPEC 06/17/2008   TOBACCO USE 04/03/2008   NECK MASS 03/11/2008   ACUTE LYMPHADENITIS 02/26/2008   HYPOTHYROIDISM 02/04/2008   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   BACK PAIN, CHRONIC 02/04/2008   DIZZINESS 02/04/2008    Past Surgical History:  Procedure Laterality Date   APPENDECTOMY     LUMBAR LAMINECTOMY/DECOMPRESSION MICRODISCECTOMY Left 05/11/2016   Procedure: LUMBAR LAMINECTOMY/DECOMPRESSION MICRODISCECTOMY L2-3;  Surgeon: Meade Maw, MD;  Location: [facility] ORS;  Service: Neurosurgery;  Laterality: Left;   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 Authorizing Provider  azithromycin (ZITHROMAX) 250 MG tablet Take 1 tablet (250 mg total) by mouth daily. Take first 2 tablets together, then 1 every day until finished. 03/30/21  Yes Sharion Balloon, NP  celecoxib (CELEBREX) 100 MG capsule Take 100 mg by mouth in the morning and at bedtime. 08/21/20   [provider]  docusate sodium (COLACE) 100 MG capsule Take 1 capsule (100 mg total) by mouth 2 (two) times daily. 11/04/20   Duanne Guess, PA-C  enoxaparin (LOVENOX) 40 MG/0.4ML injection Inject 0.4 mLs (40 mg total) into the skin daily for 14 days. 11/04/20 11/18/20  Duanne Guess, PA-C  escitalopram (LEXAPRO) 10 MG tablet Take 10 mg by mouth in the morning. 10/22/18   [provider]  HYDROcodone-acetaminophen (NORCO) 7.5-325 MG tablet Take 1-2 tablets by mouth every 4 (four) hours as needed for severe pain (pain score 7-10). 11/04/20   Duanne Guess, PA-C  levothyroxine (SYNTHROID, LEVOTHROID) 100 MCG tablet Take 100 mcg by mouth daily before breakfast.    [provider]  methocarbamol (ROBAXIN) 500 MG tablet Take 1 tablet (500 mg total) by mouth every 6 (six) hours as needed for muscle spasms. 11/04/20   Duanne Guess, PA-C  rosuvastatin (CRESTOR) 20 MG tablet Take 20-40 mg by  mouth See admin instructions. Take 2 tablets (40 mg) by mouth on Mondays & Thursdays in the evening. Take 1 tablet (20 mg) by mouth on Sundays, Tuesdays, Wednesdays, Fridays & Saturdays in the evening. 08/03/20   [provider]  traMADol (ULTRAM) 50 MG tablet Take 1 tablet (50 mg total) by mouth every 6 (six) hours. 11/04/20   Duanne Guess, PA-C  triamcinolone cream (KENALOG) 0.1 % Apply 1 application topically 2 (two) times daily as needed. 02/09/21   Scot Jun, FNP     Family History Family History  Problem Relation Age of Onset   Diabetes Mother     Social History Social History   Tobacco Use   Smoking status: Every Day    Packs/day: 1.00    Types: Cigarettes   Smokeless tobacco: Never  Vaping Use   Vaping Use: Never used  Substance Use Topics   Alcohol use: No   Drug use: Yes    Types: Marijuana    Comment: occ     Allergies   Patient has no known allergies.   Review of Systems Review of Systems  Constitutional:  Negative for chills and fever.  HENT:  Positive for congestion. Negative for ear pain and sore throat.   Respiratory:  Positive for cough. Negative for shortness of breath.   Cardiovascular:  Negative for chest pain and palpitations.  Gastrointestinal:  Negative for diarrhea and vomiting.  Skin:  Negative for color change and rash.  All other systems reviewed and are negative.   Physical Exam Triage Vital Signs ED Triage Vitals [03/30/21 0949]  Enc Vitals Group     BP 132/79     Pulse Rate 67     Resp 18     Temp 97.9 F (36.6 C)     Temp src      SpO2 98 %     Weight      Height      Head Circumference      Peak Flow      Pain Score      Pain Loc      Pain Edu?      Excl. in [place]?    No data found.  Updated Vital Signs BP 132/79    Pulse 67    Temp 97.9 F (36.6 C)    Resp 18    SpO2 98%   Visual Acuity Right Eye Distance:   Left Eye Distance:   Bilateral Distance:    Right Eye Near:   Left Eye Near:    Bilateral Near:     Physical Exam Vitals and nursing note reviewed.  Constitutional:      General: He is not in acute distress.    Appearance: He is well-developed. He is not ill-appearing.  HENT:     Right Ear: Tympanic membrane normal.     Left Ear: Tympanic membrane normal.     Nose: Nose normal.     Mouth/Throat:     Mouth: Mucous membranes are moist.     Pharynx: Oropharynx is clear.  Cardiovascular:     Rate and Rhythm: Normal rate and regular rhythm.     Heart sounds:  Normal heart sounds.  Pulmonary:     Effort: Pulmonary effort is normal. No respiratory distress.     Breath sounds: Rhonchi present.     Comments: Scattered rhonchi throughout. Musculoskeletal:     Cervical back: Neck supple.  Skin:    General: Skin is warm and dry.  Neurological:     Mental Status: He is alert.  Psychiatric:        Mood and Affect: Mood normal.        Behavior: Behavior normal.     UC Treatments / Results  Labs (all labs ordered are listed, but only abnormal results are displayed) Labs Reviewed  NOVEL CORONAVIRUS, NAA    EKG   Radiology No results found.  Procedures Procedures (including critical care time)  Medications Ordered in UC Medications - No data to display  Initial Impression / Assessment and Plan / UC Course  I have reviewed the triage vital signs and the nursing notes.  Pertinent labs & imaging results that were available during my care of the patient were reviewed by me and considered in my medical decision making (see chart for details).    Acute bronchitis.  Patient declines chest x-ray.  He has been symptomatic for 10 days and is not improving with OTC treatment.  COVID pending.  Instructed patient to self quarantine per CDC guidelines.  Treating with Zithromax.  Discussed symptomatic treatment including Tylenol or ibuprofen, rest, hydration.  Instructed patient to follow up with PCP for recheck of his lungs in 2 to 3 days.  Patient agrees to plan of care.   Final Clinical Impressions(s) / UC Diagnoses   Final diagnoses:  Acute bronchitis, unspecified organism     Discharge Instructions      Take the Zithromax as directed.  Follow up with your primary care provider for a recheck of your lungs in 2-3 days.        ED Prescriptions     Medication Sig Dispense Auth. Provider   azithromycin (ZITHROMAX) 250 MG tablet Take 1 tablet (250 mg total) by mouth daily. Take first 2 tablets together, then 1 every day until finished. 6  tablet Sharion Balloon, NP      PDMP not reviewed this encounter.   Sharion Balloon, NP 03/30/21 1051

## 2021-03-31 LAB — NOVEL CORONAVIRUS, NAA: SARS-CoV-2, NAA: NOT DETECTED

## 2021-03-31 LAB — SARS-COV-2, NAA 2 DAY TAT

## 2021-04-13 DIAGNOSIS — E039 Hypothyroidism, unspecified: Secondary | ICD-10-CM | POA: Diagnosis not present

## 2021-04-13 DIAGNOSIS — E559 Vitamin D deficiency, unspecified: Secondary | ICD-10-CM | POA: Diagnosis not present

## 2021-04-13 DIAGNOSIS — E291 Testicular hypofunction: Secondary | ICD-10-CM | POA: Diagnosis not present

## 2021-04-13 DIAGNOSIS — E236 Other disorders of pituitary gland: Secondary | ICD-10-CM | POA: Diagnosis not present

## 2021-04-13 DIAGNOSIS — E782 Mixed hyperlipidemia: Secondary | ICD-10-CM | POA: Diagnosis not present

## 2021-04-13 DIAGNOSIS — R7301 Impaired fasting glucose: Secondary | ICD-10-CM | POA: Diagnosis not present

## 2021-05-12 DIAGNOSIS — M160 Bilateral primary osteoarthritis of hip: Secondary | ICD-10-CM | POA: Diagnosis not present

## 2021-05-12 DIAGNOSIS — G8929 Other chronic pain: Secondary | ICD-10-CM | POA: Diagnosis not present

## 2021-05-12 DIAGNOSIS — I700 Atherosclerosis of aorta: Secondary | ICD-10-CM | POA: Diagnosis not present

## 2021-05-12 DIAGNOSIS — M25552 Pain in left hip: Secondary | ICD-10-CM | POA: Diagnosis not present

## 2021-05-12 DIAGNOSIS — M25551 Pain in right hip: Secondary | ICD-10-CM | POA: Diagnosis not present

## 2021-05-25 ENCOUNTER — Other Ambulatory Visit: Payer: Self-pay | Admitting: Orthopedic Surgery

## 2021-06-10 ENCOUNTER — Encounter
Admission: RE | Admit: 2021-06-10 | Discharge: 2021-06-10 | Disposition: A | Discharge status: Home / Self Care | Payer: Medicare HMO | Source: Ambulatory Visit | Attending: Orthopedic Surgery | Admitting: Orthopedic Surgery

## 2021-06-10 VITALS — BP 114/72 | HR 71

## 2021-06-10 DIAGNOSIS — Z01818 Encounter for other preprocedural examination: Secondary | ICD-10-CM | POA: Diagnosis not present

## 2021-06-10 DIAGNOSIS — Z01812 Encounter for preprocedural laboratory examination: Secondary | ICD-10-CM

## 2021-06-10 HISTORY — DX: Pneumonia, unspecified organism: J18.9

## 2021-06-10 HISTORY — DX: Anxiety disorder, unspecified: F41.9

## 2021-06-10 HISTORY — DX: Other psychoactive substance abuse, uncomplicated: F19.10

## 2021-06-10 HISTORY — DX: Headache, unspecified: R51.9

## 2021-06-10 LAB — URINALYSIS, COMPLETE (UACMP) WITH MICROSCOPIC
Bacteria, UA: NONE SEEN
Bilirubin Urine: NEGATIVE
Glucose, UA: NEGATIVE mg/dL
Hgb urine dipstick: NEGATIVE
Ketones, ur: NEGATIVE mg/dL
Leukocytes,Ua: NEGATIVE
Nitrite: NEGATIVE
Protein, ur: NEGATIVE mg/dL
Specific Gravity, Urine: 1.016 (ref 1.005–1.030)
Squamous Epithelial / HPF: NONE SEEN (ref 0–5)
pH: 6 (ref 5.0–8.0)

## 2021-06-10 LAB — CBC WITH DIFFERENTIAL/PLATELET
Abs Immature Granulocytes: 0.01 10*3/uL (ref 0.00–0.07)
Basophils Absolute: 0.1 10*3/uL (ref 0.0–0.1)
Basophils Relative: 1 %
Eosinophils Absolute: 0.2 10*3/uL (ref 0.0–0.5)
Eosinophils Relative: 3 %
HCT: 44.4 % (ref 39.0–52.0)
Hemoglobin: 14.7 g/dL (ref 13.0–17.0)
Immature Granulocytes: 0 %
Lymphocytes Relative: 31 %
Lymphs Abs: 2.6 10*3/uL (ref 0.7–4.0)
MCH: 28.3 pg (ref 26.0–34.0)
MCHC: 33.1 g/dL (ref 30.0–36.0)
MCV: 85.5 fL (ref 80.0–100.0)
Monocytes Absolute: 0.6 10*3/uL (ref 0.1–1.0)
Monocytes Relative: 7 %
Neutro Abs: 4.8 10*3/uL (ref 1.7–7.7)
Neutrophils Relative %: 58 %
Platelets: 229 10*3/uL (ref 150–400)
RBC: 5.19 MIL/uL (ref 4.22–5.81)
RDW: 13.3 % (ref 11.5–15.5)
WBC: 8.4 10*3/uL (ref 4.0–10.5)
nRBC: 0 % (ref 0.0–0.2)

## 2021-06-10 LAB — TYPE AND SCREEN
ABO/RH(D): A POS
Antibody Screen: NEGATIVE

## 2021-06-10 LAB — COMPREHENSIVE METABOLIC PANEL
ALT: 17 U/L (ref 0–44)
AST: 19 U/L (ref 15–41)
Albumin: 3.9 g/dL (ref 3.5–5.0)
Alkaline Phosphatase: 44 U/L (ref 38–126)
Anion gap: 4 — ABNORMAL LOW (ref 5–15)
BUN: 16 mg/dL (ref 8–23)
CO2: 29 mmol/L (ref 22–32)
Calcium: 8.9 mg/dL (ref 8.9–10.3)
Chloride: 105 mmol/L (ref 98–111)
Creatinine, Ser: 1 mg/dL (ref 0.61–1.24)
GFR, Estimated: >60 mL/min (ref >60)
Glucose, Bld: 100 mg/dL — ABNORMAL HIGH (ref 70–99)
Potassium: 4.4 mmol/L (ref 3.5–5.1)
Sodium: 138 mmol/L (ref 135–145)
Total Bilirubin: 0.5 mg/dL (ref 0.3–1.2)
Total Protein: 6.9 g/dL (ref 6.5–8.1)

## 2021-06-10 LAB — SURGICAL PCR SCREEN
MRSA, PCR: NEGATIVE
Staphylococcus aureus: NEGATIVE

## 2021-06-10 MED ORDER — FAMOTIDINE 20 MG PO TABS: 20.0000 mg | ORAL_TABLET | Freq: Once | ORAL | Status: DC

## 2021-06-10 NOTE — Patient Instructions (Addendum)
Your procedure is scheduled on: 06/22/21 - Tuesday ?Report to the Registration Desk on the 1st floor of the [facility]. ?To find out your arrival time, please call [phone] between 1PM - 3PM on: 06/21/21 - Monday ? ?REMEMBER: ?Instructions that are not followed completely may result in serious medical risk, up to and including death; or upon the discretion of your surgeon and anesthesiologist your surgery may need to be rescheduled. ? ?Do not eat food after midnight the night before surgery.  ?No gum chewing, lozengers or hard candies. ? ?You may however, drink CLEAR liquids up to 2 hours before you are scheduled to arrive for your surgery. Do not drink anything within 2 hours of your scheduled arrival time. ? ?Clear liquids include: ?- water  ?- apple juice without pulp ?- gatorade (not RED colors) ?- black coffee or tea (Do NOT add milk or creamers to the coffee or tea) ?Do NOT drink anything that is not on this list. ? ?In addition, your doctor has ordered for you to drink the provided  ?Ensure Pre-Surgery Clear Carbohydrate Drink  ?Drinking this carbohydrate drink up to two hours before surgery helps to reduce insulin resistance and improve patient outcomes. Please complete drinking 2 hours prior to scheduled arrival time. ? ?TAKE ONLY THESE MEDICATIONS THE MORNING OF SURGERY WITH A SIP OF WATER: ? ?- celecoxib (CELEBREX) 100 MG capsule ?- escitalopram (LEXAPRO) 20 MG tablet ?- levothyroxine (SYNTHROID, LEVOTHROID) 100 MCG  ? ?One week prior to surgery: ?Stop Anti-inflammatories (NSAIDS) such as Advil, Aleve, Ibuprofen, Motrin, Naproxen, Naprosyn and Aspirin based products such as Excedrin, Goodys Powder, BC Powder. ? ?Stop ANY OVER THE COUNTER supplements until after surgery. ? ?You may however, continue to take Tylenol if needed for pain up until the day of surgery. ? ?No Alcohol for 24 hours before or after surgery. ? ?No Smoking including e-cigarettes for 24 hours prior to surgery.  ?No chewable  tobacco products for at least 6 hours prior to surgery.  ?No nicotine patches on the day of surgery. ? ?Do not use any "recreational" drugs for at least a week prior to your surgery.  ?Please be advised that the combination of cocaine and anesthesia may have negative outcomes, up to and including death. ?If you test positive for cocaine, your surgery will be cancelled. ? ?On the morning of surgery brush your teeth with toothpaste and water, you may rinse your mouth with mouthwash if you wish. ?Do not swallow any toothpaste or mouthwash. ? ?Use CHG Soap or wipes as directed on instruction sheet. ? ?Do not wear jewelry, make-up, hairpins, clips or nail polish. ? ?Do not wear lotions, powders, or perfumes.  ? ?Do not shave body from the neck down 48 hours prior to surgery just in case you cut yourself which could leave a site for infection.  ?Also, freshly shaved skin may become irritated if using the CHG soap. ? ?Contact lenses, hearing aids and dentures may not be worn into surgery. ? ?Do not bring valuables to the hospital. [facility] is not responsible for any missing/lost belongings or valuables.  ? ?Notify your doctor if there is any change in your medical condition (cold, fever, infection). ? ?Wear comfortable clothing (specific to your surgery type) to the hospital. ? ?After surgery, you can help prevent lung complications by doing breathing exercises.  ?Take deep breaths and cough every 1-2 hours. Your doctor may order a device called an Incentive Spirometer to help you take deep breaths. ?When coughing  or sneezing, hold a pillow firmly against your incision with both hands. This is called ?splinting.? Doing this helps protect your incision. It also decreases belly discomfort. ? ?If you are being admitted to the hospital overnight, leave your suitcase in the car. ?After surgery it may be brought to your room. ? ?If you are being discharged the day of surgery, you will not be allowed to drive home. ?You will  need a responsible adult (18 years or older) to drive you home and stay with you that night.  ? ?If you are taking public transportation, you will need to have a responsible adult (18 years or older) with you. ?Please confirm with your physician that it is acceptable to use public transportation.  ? ?Please call the [facility] at [phone] if you have any questions about these instructions. ? ?Surgery Visitation Policy: ? ?Patients undergoing a surgery or procedure may have two family members or support persons with them as long as the person is not COVID-19 positive or experiencing its symptoms.  ? ?Inpatient Visitation:   ? ?Visiting hours are 7 a.m. to 8 p.m. ?Up to four visitors are allowed at one time in a patient room, including children. The visitors may rotate out with other people during the day. One designated support person (adult) may remain overnight.  ?

## 2021-06-14 DIAGNOSIS — M1611 Unilateral primary osteoarthritis, right hip: Secondary | ICD-10-CM | POA: Diagnosis not present

## 2021-06-22 ENCOUNTER — Ambulatory Visit: Payer: Medicare HMO | Admitting: Certified Registered Nurse Anesthetist

## 2021-06-22 ENCOUNTER — Observation Stay: Payer: Medicare HMO

## 2021-06-22 ENCOUNTER — Ambulatory Visit: Payer: Medicare HMO

## 2021-06-22 ENCOUNTER — Encounter
Admission: RE | Disposition: A | Discharge status: Home Health | Payer: Self-pay | Source: Home / Self Care | Attending: Orthopedic Surgery

## 2021-06-22 ENCOUNTER — Other Ambulatory Visit: Payer: Self-pay

## 2021-06-22 ENCOUNTER — Observation Stay
Admission: RE | Admit: 2021-06-22 | Discharge: 2021-06-23 | Disposition: A | Discharge status: Home Health | Payer: Medicare HMO | Attending: Orthopedic Surgery | Admitting: Orthopedic Surgery

## 2021-06-22 ENCOUNTER — Encounter: Payer: Self-pay | Admitting: Orthopedic Surgery

## 2021-06-22 DIAGNOSIS — Z96641 Presence of right artificial hip joint: Secondary | ICD-10-CM | POA: Diagnosis not present

## 2021-06-22 DIAGNOSIS — E890 Postprocedural hypothyroidism: Secondary | ICD-10-CM | POA: Insufficient documentation

## 2021-06-22 DIAGNOSIS — M1611 Unilateral primary osteoarthritis, right hip: Principal | ICD-10-CM | POA: Insufficient documentation

## 2021-06-22 DIAGNOSIS — Z720 Tobacco use: Secondary | ICD-10-CM | POA: Diagnosis not present

## 2021-06-22 DIAGNOSIS — Z79899 Other long term (current) drug therapy: Secondary | ICD-10-CM | POA: Insufficient documentation

## 2021-06-22 DIAGNOSIS — Z471 Aftercare following joint replacement surgery: Secondary | ICD-10-CM | POA: Diagnosis not present

## 2021-06-22 DIAGNOSIS — Z96642 Presence of left artificial hip joint: Secondary | ICD-10-CM | POA: Insufficient documentation

## 2021-06-22 HISTORY — PX: TOTAL HIP ARTHROPLASTY: SHX124

## 2021-06-22 LAB — CBC
HCT: 38.9 % — ABNORMAL LOW (ref 39.0–52.0)
Hemoglobin: 13 g/dL (ref 13.0–17.0)
MCH: 28.8 pg (ref 26.0–34.0)
MCHC: 33.4 g/dL (ref 30.0–36.0)
MCV: 86.3 fL (ref 80.0–100.0)
Platelets: 191 10*3/uL (ref 150–400)
RBC: 4.51 MIL/uL (ref 4.22–5.81)
RDW: 13.2 % (ref 11.5–15.5)
WBC: 15.3 10*3/uL — ABNORMAL HIGH (ref 4.0–10.5)
nRBC: 0 % (ref 0.0–0.2)

## 2021-06-22 LAB — CREATININE, SERUM
Creatinine, Ser: 1.04 mg/dL (ref 0.61–1.24)
GFR, Estimated: >60 mL/min (ref >60)

## 2021-06-22 SURGERY — ARTHROPLASTY, HIP, TOTAL, ANTERIOR APPROACH
Anesthesia: Spinal | Site: Hip | Laterality: Right

## 2021-06-22 MED ORDER — ORAL CARE MOUTH RINSE: 15.0000 mL | Freq: Once | OROMUCOSAL | Status: AC

## 2021-06-22 MED ORDER — CEFAZOLIN SODIUM-DEXTROSE 2-4 GM/100ML-% IV SOLN
2.0000 g | INTRAVENOUS | Status: AC
  Administered 2021-06-22: 2 g via INTRAVENOUS

## 2021-06-22 MED ORDER — FENTANYL CITRATE (PF) 100 MCG/2ML IJ SOLN: 25.0000 ug | INTRAMUSCULAR | Status: DC | PRN

## 2021-06-22 MED ORDER — LIDOCAINE HCL 1 % IJ SOLN
INTRAMUSCULAR | Status: DC | PRN
  Administered 2021-06-22: 20 mL via INTRADERMAL

## 2021-06-22 MED ORDER — BUPIVACAINE-EPINEPHRINE (PF) 0.25% -1:200000 IJ SOLN
INTRAMUSCULAR | Status: AC
  Filled 2021-06-22: qty 60

## 2021-06-22 MED ORDER — CEFAZOLIN SODIUM-DEXTROSE 2-4 GM/100ML-% IV SOLN
INTRAVENOUS | Status: AC
  Filled 2021-06-22: qty 100

## 2021-06-22 MED ORDER — SODIUM CHLORIDE 0.9 % IV SOLN: INTRAVENOUS | Status: DC

## 2021-06-22 MED ORDER — OXYCODONE HCL 5 MG PO TABS
10.0000 mg | ORAL_TABLET | ORAL | Status: DC | PRN
  Administered 2021-06-22 (×3): 15 mg via ORAL
  Filled 2021-06-22: qty 3
  Filled 2021-06-22: qty 2
  Filled 2021-06-22 (×2): qty 3

## 2021-06-22 MED ORDER — PHENOL 1.4 % MT LIQD: 1.0000 | OROMUCOSAL | Status: DC | PRN

## 2021-06-22 MED ORDER — METOCLOPRAMIDE HCL 5 MG/ML IJ SOLN: 5.0000 mg | Freq: Three times a day (TID) | INTRAMUSCULAR | Status: DC | PRN

## 2021-06-22 MED ORDER — TRAMADOL HCL 50 MG PO TABS
50.0000 mg | ORAL_TABLET | Freq: Four times a day (QID) | ORAL | Status: DC
  Administered 2021-06-22 – 2021-06-23 (×4): 50 mg via ORAL
  Filled 2021-06-22 (×4): qty 1

## 2021-06-22 MED ORDER — ROSUVASTATIN CALCIUM 10 MG PO TABS: 20.0000 mg | ORAL_TABLET | ORAL | Status: DC

## 2021-06-22 MED ORDER — BUPIVACAINE HCL (PF) 0.5 % IJ SOLN
INTRAMUSCULAR | Status: DC | PRN
Start: 2021-06-22 — End: 2021-06-22
  Administered 2021-06-22: 2.5 mL via INTRATHECAL

## 2021-06-22 MED ORDER — HEMOSTATIC AGENTS (NO CHARGE) OPTIME
TOPICAL | Status: DC | PRN
  Administered 2021-06-22: 2 via TOPICAL

## 2021-06-22 MED ORDER — DIPHENHYDRAMINE HCL 12.5 MG/5ML PO ELIX: 12.5000 mg | ORAL_SOLUTION | ORAL | Status: DC | PRN

## 2021-06-22 MED ORDER — ENOXAPARIN SODIUM 40 MG/0.4ML IJ SOSY
40.0000 mg | PREFILLED_SYRINGE | INTRAMUSCULAR | Status: DC
  Administered 2021-06-23: 40 mg via SUBCUTANEOUS
  Filled 2021-06-22: qty 0.4

## 2021-06-22 MED ORDER — PHENYLEPHRINE HCL-NACL 20-0.9 MG/250ML-% IV SOLN
INTRAVENOUS | Status: AC
  Filled 2021-06-22: qty 250

## 2021-06-22 MED ORDER — SODIUM CHLORIDE FLUSH 0.9 % IV SOLN
INTRAVENOUS | Status: AC
  Filled 2021-06-22: qty 80

## 2021-06-22 MED ORDER — BUPIVACAINE HCL (PF) 0.5 % IJ SOLN
INTRAMUSCULAR | Status: AC
  Filled 2021-06-22: qty 10

## 2021-06-22 MED ORDER — EPHEDRINE 5 MG/ML INJ
INTRAVENOUS | Status: AC
  Filled 2021-06-22: qty 5

## 2021-06-22 MED ORDER — KETAMINE HCL 50 MG/5ML IJ SOSY
PREFILLED_SYRINGE | INTRAMUSCULAR | Status: AC
  Filled 2021-06-22: qty 5

## 2021-06-22 MED ORDER — CHLORHEXIDINE GLUCONATE 0.12 % MT SOLN
OROMUCOSAL | Status: AC
  Administered 2021-06-22: 15 mL via OROMUCOSAL
  Filled 2021-06-22: qty 15

## 2021-06-22 MED ORDER — DEXMEDETOMIDINE (PRECEDEX) IN NS 20 MCG/5ML (4 MCG/ML) IV SYRINGE
PREFILLED_SYRINGE | INTRAVENOUS | Status: DC | PRN
  Administered 2021-06-22: 12 ug via INTRAVENOUS
  Administered 2021-06-22: 8 ug via INTRAVENOUS

## 2021-06-22 MED ORDER — ACETAMINOPHEN 10 MG/ML IV SOLN
INTRAVENOUS | Status: DC | PRN
  Administered 2021-06-22: 1000 mg via INTRAVENOUS

## 2021-06-22 MED ORDER — LIDOCAINE HCL (CARDIAC) PF 100 MG/5ML IV SOSY: PREFILLED_SYRINGE | INTRAVENOUS | Status: DC | PRN

## 2021-06-22 MED ORDER — ZOLPIDEM TARTRATE 5 MG PO TABS: 5.0000 mg | ORAL_TABLET | Freq: Every evening | ORAL | Status: DC | PRN

## 2021-06-22 MED ORDER — OXYCODONE HCL 5 MG PO TABS
5.0000 mg | ORAL_TABLET | ORAL | Status: DC | PRN
  Administered 2021-06-23: 10 mg via ORAL

## 2021-06-22 MED ORDER — PANTOPRAZOLE SODIUM 40 MG PO TBEC
40.0000 mg | DELAYED_RELEASE_TABLET | Freq: Every day | ORAL | Status: DC
  Administered 2021-06-22 – 2021-06-23 (×2): 40 mg via ORAL
  Filled 2021-06-22 (×2): qty 1

## 2021-06-22 MED ORDER — GLYCOPYRROLATE 0.2 MG/ML IJ SOLN
INTRAMUSCULAR | Status: AC
  Filled 2021-06-22: qty 1

## 2021-06-22 MED ORDER — PHENYLEPHRINE 80 MCG/ML (10ML) SYRINGE FOR IV PUSH (FOR BLOOD PRESSURE SUPPORT)
PREFILLED_SYRINGE | INTRAVENOUS | Status: AC
  Filled 2021-06-22: qty 10

## 2021-06-22 MED ORDER — DEXTROSE 5 % IV SOLN
500.0000 mg | Freq: Four times a day (QID) | INTRAVENOUS | Status: DC | PRN
  Filled 2021-06-22: qty 5

## 2021-06-22 MED ORDER — ALUM & MAG HYDROXIDE-SIMETH 200-200-20 MG/5ML PO SUSP: 30.0000 mL | ORAL | Status: DC | PRN

## 2021-06-22 MED ORDER — BUPIVACAINE LIPOSOME 1.3 % IJ SUSP
INTRAMUSCULAR | Status: AC
  Filled 2021-06-22: qty 40

## 2021-06-22 MED ORDER — PROPOFOL 500 MG/50ML IV EMUL
INTRAVENOUS | Status: DC | PRN
  Administered 2021-06-22: 100 ug/kg/min via INTRAVENOUS

## 2021-06-22 MED ORDER — FENTANYL CITRATE (PF) 100 MCG/2ML IJ SOLN
INTRAMUSCULAR | Status: DC | PRN
  Administered 2021-06-22: 50 ug via INTRAVENOUS

## 2021-06-22 MED ORDER — HYDROMORPHONE HCL 1 MG/ML IJ SOLN
0.5000 mg | INTRAMUSCULAR | Status: DC | PRN
  Administered 2021-06-22 – 2021-06-23 (×3): 1 mg via INTRAVENOUS
  Filled 2021-06-22 (×3): qty 1

## 2021-06-22 MED ORDER — PHENYLEPHRINE HCL (PRESSORS) 10 MG/ML IV SOLN
INTRAVENOUS | Status: DC | PRN
  Administered 2021-06-22: 160 ug via INTRAVENOUS
  Administered 2021-06-22 (×2): 80 ug via INTRAVENOUS

## 2021-06-22 MED ORDER — ESCITALOPRAM OXALATE 10 MG PO TABS
20.0000 mg | ORAL_TABLET | Freq: Every day | ORAL | Status: DC
  Administered 2021-06-23: 20 mg via ORAL
  Filled 2021-06-22: qty 2

## 2021-06-22 MED ORDER — CHLORHEXIDINE GLUCONATE 0.12 % MT SOLN: 15.0000 mL | Freq: Once | OROMUCOSAL | Status: AC

## 2021-06-22 MED ORDER — ONDANSETRON HCL 4 MG PO TABS: 4.0000 mg | ORAL_TABLET | Freq: Four times a day (QID) | ORAL | Status: DC | PRN

## 2021-06-22 MED ORDER — ACETAMINOPHEN 325 MG PO TABS: 325.0000 mg | ORAL_TABLET | Freq: Four times a day (QID) | ORAL | Status: DC | PRN

## 2021-06-22 MED ORDER — ONDANSETRON HCL 4 MG/2ML IJ SOLN: 4.0000 mg | Freq: Four times a day (QID) | INTRAMUSCULAR | Status: DC | PRN

## 2021-06-22 MED ORDER — FENTANYL CITRATE (PF) 100 MCG/2ML IJ SOLN
INTRAMUSCULAR | Status: AC
  Filled 2021-06-22: qty 2

## 2021-06-22 MED ORDER — SURGIPHOR WOUND IRRIGATION SYSTEM - OPTIME: TOPICAL | Status: DC | PRN

## 2021-06-22 MED ORDER — MIDAZOLAM HCL 5 MG/5ML IJ SOLN
INTRAMUSCULAR | Status: DC | PRN
Start: 2021-06-22 — End: 2021-06-22
  Administered 2021-06-22: 2 mg via INTRAVENOUS

## 2021-06-22 MED ORDER — EPHEDRINE SULFATE (PRESSORS) 50 MG/ML IJ SOLN
INTRAMUSCULAR | Status: DC | PRN
  Administered 2021-06-22: 2.5 mg via INTRAVENOUS
  Administered 2021-06-22: 5 mg via INTRAVENOUS
  Administered 2021-06-22: 10 mg via INTRAVENOUS
  Administered 2021-06-22: 2.5 mg via INTRAVENOUS
  Administered 2021-06-22: 5 mg via INTRAVENOUS

## 2021-06-22 MED ORDER — LEVOTHYROXINE SODIUM 50 MCG PO TABS
100.0000 ug | ORAL_TABLET | Freq: Every day | ORAL | Status: DC
  Administered 2021-06-23: 100 ug via ORAL
  Filled 2021-06-22: qty 2

## 2021-06-22 MED ORDER — ACETAMINOPHEN 10 MG/ML IV SOLN
INTRAVENOUS | Status: AC
  Filled 2021-06-22: qty 100

## 2021-06-22 MED ORDER — MIDAZOLAM HCL 2 MG/2ML IJ SOLN
INTRAMUSCULAR | Status: AC
  Filled 2021-06-22: qty 2

## 2021-06-22 MED ORDER — PROPOFOL 1000 MG/100ML IV EMUL
INTRAVENOUS | Status: AC
  Filled 2021-06-22: qty 100

## 2021-06-22 MED ORDER — METOCLOPRAMIDE HCL 5 MG PO TABS
5.0000 mg | ORAL_TABLET | Freq: Three times a day (TID) | ORAL | Status: DC | PRN
  Filled 2021-06-22: qty 2

## 2021-06-22 MED ORDER — ROSUVASTATIN CALCIUM 10 MG PO TABS: 40.0000 mg | ORAL_TABLET | ORAL | Status: DC

## 2021-06-22 MED ORDER — CEFAZOLIN SODIUM-DEXTROSE 2-4 GM/100ML-% IV SOLN
2.0000 g | Freq: Four times a day (QID) | INTRAVENOUS | Status: AC
  Administered 2021-06-22 (×2): 2 g via INTRAVENOUS
  Filled 2021-06-22 (×2): qty 100

## 2021-06-22 MED ORDER — MENTHOL 3 MG MT LOZG: 1.0000 | LOZENGE | OROMUCOSAL | Status: DC | PRN

## 2021-06-22 MED ORDER — 0.9 % SODIUM CHLORIDE (POUR BTL) OPTIME
TOPICAL | Status: DC | PRN
  Administered 2021-06-22: 1000 mL

## 2021-06-22 MED ORDER — SENNOSIDES-DOCUSATE SODIUM 8.6-50 MG PO TABS: 1.0000 | ORAL_TABLET | Freq: Every evening | ORAL | Status: DC | PRN

## 2021-06-22 MED ORDER — BISACODYL 5 MG PO TBEC
5.0000 mg | DELAYED_RELEASE_TABLET | Freq: Every day | ORAL | Status: DC | PRN
  Administered 2021-06-23: 5 mg via ORAL
  Filled 2021-06-22: qty 1

## 2021-06-22 MED ORDER — METHOCARBAMOL 500 MG PO TABS
500.0000 mg | ORAL_TABLET | Freq: Four times a day (QID) | ORAL | Status: DC | PRN
  Administered 2021-06-22 (×2): 500 mg via ORAL
  Filled 2021-06-22 (×2): qty 1

## 2021-06-22 MED ORDER — LIDOCAINE HCL (PF) 1 % IJ SOLN
INTRAMUSCULAR | Status: AC
  Filled 2021-06-22: qty 30

## 2021-06-22 MED ORDER — FLEET ENEMA 7-19 GM/118ML RE ENEM
1.0000 | ENEMA | Freq: Once | RECTAL | Status: DC | PRN
Start: 2021-06-22 — End: 2021-06-23

## 2021-06-22 MED ORDER — LACTATED RINGERS IV SOLN: INTRAVENOUS | Status: DC

## 2021-06-22 MED ORDER — SODIUM CHLORIDE (PF) 0.9 % IJ SOLN
INTRAMUSCULAR | Status: DC | PRN
  Administered 2021-06-22: 90 mL via INTRAMUSCULAR

## 2021-06-22 MED ORDER — DEXMEDETOMIDINE HCL IN NACL 80 MCG/20ML IV SOLN
INTRAVENOUS | Status: AC
  Filled 2021-06-22: qty 20

## 2021-06-22 MED ORDER — KETAMINE HCL 10 MG/ML IJ SOLN
INTRAMUSCULAR | Status: DC | PRN
  Administered 2021-06-22: 30 mg via INTRAVENOUS

## 2021-06-22 MED ORDER — DOCUSATE SODIUM 100 MG PO CAPS
100.0000 mg | ORAL_CAPSULE | Freq: Two times a day (BID) | ORAL | Status: DC
  Administered 2021-06-22 – 2021-06-23 (×3): 100 mg via ORAL
  Filled 2021-06-22 (×3): qty 1

## 2021-06-22 SURGICAL SUPPLY — 55 items
BLADE SAGITTAL AGGR TOOTH XLG (BLADE) ×2 IMPLANT
BNDG COHESIVE 6X5 TAN ST LF (GAUZE/BANDAGES/DRESSINGS) ×5 IMPLANT
CANISTER WOUND CARE 500ML ATS (WOUND CARE) ×2 IMPLANT
CHLORAPREP W/TINT 26 (MISCELLANEOUS) ×2 IMPLANT
COVER BACK TABLE REUSABLE LG (DRAPES) ×2 IMPLANT
DRAPE 3/4 80X56 (DRAPES) ×5 IMPLANT
DRAPE C-ARM XRAY 36X54 (DRAPES) ×2 IMPLANT
DRAPE INCISE IOBAN 66X60 STRL (DRAPES) IMPLANT
DRAPE POUCH INSTRU U-SHP 10X18 (DRAPES) ×2 IMPLANT
DRESSING SURGICEL FIBRLLR 1X2 (HEMOSTASIS) ×2 IMPLANT
DRSG MEPILEX SACRM 8.7X9.8 (GAUZE/BANDAGES/DRESSINGS) ×2 IMPLANT
DRSG SURGICEL FIBRILLAR 1X2 (HEMOSTASIS) ×4
ELECT BLADE 6.5 EXT (BLADE) ×2 IMPLANT
ELECT REM PT RETURN 9FT ADLT (ELECTROSURGICAL) ×2
ELECTRODE REM PT RTRN 9FT ADLT (ELECTROSURGICAL) ×1 IMPLANT
GLOVE SURG SYN 9.0  PF PI (GLOVE) ×2
GLOVE SURG SYN 9.0 PF PI (GLOVE) ×2 IMPLANT
GLOVE SURG UNDER POLY LF SZ9 (GLOVE) ×2 IMPLANT
GOWN SRG 2XL LVL 4 RGLN SLV (GOWNS) ×1 IMPLANT
GOWN STRL NON-REIN 2XL LVL4 (GOWNS) ×1
GOWN STRL REUS W/ TWL LRG LVL3 (GOWN DISPOSABLE) ×1 IMPLANT
GOWN STRL REUS W/TWL LRG LVL3 (GOWN DISPOSABLE) ×1
HEAD FEMORAL 28MM SZ S (Head) ×1 IMPLANT
HOLDER FOLEY CATH W/STRAP (MISCELLANEOUS) ×2 IMPLANT
HOOD PEEL AWAY FLYTE STAYCOOL (MISCELLANEOUS) ×2 IMPLANT
KIT PREVENA INCISION MGT 13 (CANNISTER) ×2 IMPLANT
LINER DML 28MM HIGHCROSS (Liner) ×1 IMPLANT
MANIFOLD NEPTUNE II (INSTRUMENTS) ×2 IMPLANT
MAT ABSORB  FLUID 56X50 GRAY (MISCELLANEOUS) ×1
MAT ABSORB FLUID 56X50 GRAY (MISCELLANEOUS) ×1 IMPLANT
NDL SPNL 20GX3.5 QUINCKE YW (NEEDLE) ×2 IMPLANT
NEEDLE HYPO 22GX1.5 SAFETY (NEEDLE) ×1 IMPLANT
NEEDLE SPNL 20GX3.5 QUINCKE YW (NEEDLE) ×4 IMPLANT
NS IRRIG 1000ML POUR BTL (IV SOLUTION) ×2 IMPLANT
PACK HIP COMPR (MISCELLANEOUS) ×2 IMPLANT
SCALPEL PROTECTED #10 DISP (BLADE) ×4 IMPLANT
SHELL ACETABULAR DM  60MM (Shell) ×1 IMPLANT
SOLUTION IRRIG SURGIPHOR (IV SOLUTION) ×1 IMPLANT
STAPLER SKIN PROX 35W (STAPLE) ×2 IMPLANT
STEM FEMORAL SZ5 STD COLLARED (Stem) ×1 IMPLANT
STRAP SAFETY 5IN WIDE (MISCELLANEOUS) ×1 IMPLANT
SUT DVC 2 QUILL PDO  T11 36X36 (SUTURE) ×1
SUT DVC 2 QUILL PDO T11 36X36 (SUTURE) ×1 IMPLANT
SUT SILK 0 (SUTURE) ×1
SUT SILK 0 30XBRD TIE 6 (SUTURE) ×1 IMPLANT
SUT V-LOC 90 ABS DVC 3-0 CL (SUTURE) ×2 IMPLANT
SUT VIC AB 1 CT1 36 (SUTURE) ×2 IMPLANT
SYR 20ML LL LF (SYRINGE) ×2 IMPLANT
SYR 30ML LL (SYRINGE) ×2 IMPLANT
SYR 50ML LL SCALE MARK (SYRINGE) ×4 IMPLANT
SYR BULB IRRIG 60ML STRL (SYRINGE) ×2 IMPLANT
TAPE MICROFOAM 4IN (TAPE) ×1 IMPLANT
TOWEL OR 17X26 4PK STRL BLUE (TOWEL DISPOSABLE) IMPLANT
TRAY FOLEY MTR SLVR 16FR STAT (SET/KITS/TRAYS/PACK) ×2 IMPLANT
WATER STERILE IRR 1000ML POUR (IV SOLUTION) ×1 IMPLANT

## 2021-06-22 NOTE — Plan of Care (Signed)
?  Problem: Education: ?Goal: Knowledge of General Education information will improve ?Description: Including pain rating scale, medication(s)/side effects and non-pharmacologic comfort measures ?Outcome: Progressing ?  ?Problem: Health Behavior/Discharge Planning: ?Goal: Ability to manage health-related needs will improve ?Outcome: Progressing ?  ?Problem: Clinical Measurements: ?Goal: Ability to maintain clinical measurements within normal limits will improve ?Outcome: Progressing ?Goal: Will remain free from infection ?Outcome: Progressing ?Goal: Diagnostic test results will improve ?Outcome: Progressing ?Goal: Respiratory complications will improve ?Outcome: Progressing ?Goal: Cardiovascular complication will be avoided ?Outcome: Progressing ?  ?Problem: Activity: ?Goal: Risk for activity intolerance will decrease ?Outcome: Progressing ?  ?Problem: Nutrition: ?Goal: Adequate nutrition will be maintained ?Outcome: Progressing ?  ?Problem: Coping: ?Goal: Level of anxiety will decrease ?Outcome: Progressing ?  ?Problem: Elimination: ?Goal: Will not experience complications related to bowel motility ?Outcome: Progressing ?Goal: Will not experience complications related to urinary retention ?Outcome: Progressing ?  ?Problem: Pain Managment: ?Goal: General experience of comfort will improve ?Outcome: Progressing ?  ?Problem: Safety: ?Goal: Ability to remain free from injury will improve ?Outcome: Progressing ?  ?Problem: Skin Integrity: ?Goal: Risk for impaired skin integrity will decrease ?Outcome: Progressing ?  ?Problem: Education: ?Goal: Knowledge of the prescribed therapeutic regimen will improve ?Outcome: Progressing ?Goal: Understanding of discharge needs will improve ?Outcome: Progressing ?Goal: Individualized Educational Video(s) ?Outcome: Progressing ?  ?Problem: Activity: ?Goal: Ability to avoid complications of mobility impairment will improve ?Outcome: Progressing ?Goal: Ability to tolerate increased  activity will improve ?Outcome: Progressing ?  ?Problem: Clinical Measurements: ?Goal: Postoperative complications will be avoided or minimized ?Outcome: Progressing ?  ?

## 2021-06-22 NOTE — Anesthesia Preprocedure Evaluation (Signed)
Anesthesia Evaluation  ?Patient identified by MRN, date of birth, ID band ?Patient awake ? ? ? ?Reviewed: ?Allergy & Precautions, NPO status , Patient's Chart, lab work & pertinent test results ? ?History of Anesthesia Complications ?Negative for: history of anesthetic complications ? ?Airway ?Mallampati: II ? ?TM Distance: >3 FB ?Neck ROM: Full ? ? ? Dental ? ?(+) Edentulous Upper, Missing, Poor Dentition, Dental Advidsory Given ?  ?Pulmonary ?neg shortness of breath, neg sleep apnea, neg COPD, neg recent URI, Current Smoker and Patient abstained from smoking.,  ?  ?breath sounds clear to auscultation- rhonchi ?(-) wheezing ? ? ? ? ? Cardiovascular ?Exercise Tolerance: Good ?(-) hypertension(-) angina(-) CAD, (-) Past MI, (-) Cardiac Stents and (-) CABG  ?Rhythm:Regular Rate:Normal ?- Systolic murmurs and - Diastolic murmurs ? ?  ?Neuro/Psych ?neg Seizures PSYCHIATRIC DISORDERS Anxiety Depression negative neurological ROS ?   ? GI/Hepatic ?Neg liver ROS, GERD  ,  ?Endo/Other  ?neg diabetesHypothyroidism  ? Renal/GU ?negative Renal ROS  ? ?  ?Musculoskeletal ? ?(+) Arthritis , Fibromyalgia - ? Abdominal ?(+) - obese,   ?Peds ? 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 ? Reproductive/Obstetrics ? ?  ? ? ? ? ? ? ? ? ? ? ? ? ? ?  ?  ? ? ? ? ? ? ? ? ?Lab Results  ?Component Value Date  ? WBC 8.4 06/10/2021  ? HGB 14.7 06/10/2021  ? HCT 44.4 06/10/2021  ? MCV 85.5 06/10/2021  ? PLT 229 06/10/2021  ? ? ?Anesthesia Physical ? ?Anesthesia Plan ? ?ASA: 2 ? ?Anesthesia Plan: Spinal  ? ?Post-op Pain Management:   ? ?Induction:  ? ?PONV Risk Score and Plan: 0 and Propofol infusion and TIVA ? ?Airway  Management Planned: Natural Airway ? ?Additional Equipment:  ? ?Intra-op Plan:  ? ?Post-operative Plan:  ? ?Informed Consent: I have reviewed the patients History and Physical, chart, labs and discussed the procedure including the risks, benefits and alternatives for the proposed anesthesia with the patient or authorized representative who has indicated his/her understanding and acceptance.  ? ? ? ?Dental advisory given ? ?Plan Discussed with: CRNA and Anesthesiologist ? ?Anesthesia Plan Comments:   ? ? ? ? ? ? ?Anesthesia Quick Evaluation ? ?

## 2021-06-22 NOTE — Op Note (Signed)
06/22/2021 ? ?8:48 AM ? ?PATIENT:  Dillon Singh  61 y.o. male ? ?PRE-OPERATIVE DIAGNOSIS:  Primary osteoarthritis of right hip  M16.11 ? ?POST-OPERATIVE DIAGNOSIS:  Primary osteoarthritis of right hip  M16.11 ? ?PROCEDURE:  Procedure(s): ?TOTAL HIP ARTHROPLASTY ANTERIOR APPROACH (Right) ? ?SURGEON: Laurene Footman, MD ? ?ASSISTANTS: None ? ?ANESTHESIA:   spinal ? ?EBL:  Total I/O ?In: 900 [I.V.:800; IV Piggyback:100] ?Out: 250 [Urine:200; Blood:50] ? ?BLOOD ADMINISTERED:none ? ?DRAINS:  Incisional wound VAC   ? ?LOCAL MEDICATIONS USED:  MARCAINE   , XYLOCAINE , and OTHER Exparel ? ?SPECIMEN:  Source of Specimen:    Femoral head ? ?DISPOSITION OF SPECIMEN:  PATHOLOGY ? ?COUNTS:  YES ? ?TOURNIQUET:  * No tourniquets in log * ? ?AMIS standard with impact DM 60 mm Mpact cup and liner with ceramic S 28 mm head ? ?DICTATION: .Dragon Dictation   The patient was brought to the operating room and after spinal anesthesia was obtained patient was placed on the operative table with the ipsilateral foot into the Medacta attachment, contralateral leg on a well-padded table. C-arm was brought in and preop template x-ray taken. After prepping and draping in usual sterile fashion appropriate patient identification and timeout procedures were completed.  Patient seemed to have some sensation in the top of the planned incision so 20 cc of 1% Xylocaine was infiltrated prior to start of the case.  Anterior approach to the hip was obtained and centered over the greater trochanter and TFL muscle. The subcutaneous tissue was incised hemostasis being achieved by electrocautery. TFL fascia was incised and the muscle retracted laterally deep retractor placed. The lateral femoral circumflex vessels were identified and ligated. The anterior capsule was exposed and a capsulotomy performed. The neck was identified and a femoral neck cut carried out with a saw. The head was removed without difficulty and showed mildly degenerated femoral head and  and more severe changes to the acetabulum. Reaming was carried out to 60 mm and a 60 mm cup trial gave appropriate tightness to the acetabular component a 60 DM cup was impacted into position. The leg was then externally rotated and ischiofemoral and pubofemoral releases carried out. The femur was sequentially broached to a size 5, size 5 standard with S head trials were placed and the final components chosen. The 5 standard  stem was inserted along with a ceramic S 28 mm head and 60 mm liner. The hip was reduced and was stable the wound was thoroughly irrigated with fibrillar placed along the posterior capsule and medial neck. The deep fascia ws closed using a heavy Quill after infiltration of 30 cc of quarter percent Sensorcaine with epinephrine diluted with Exparel throughout the case, .3-0 V-loc to close the skin with skin staples.  Incisional wound VAC applied and patient was sent to recovery in stable condition.  ? ?PLAN OF CARE: Admit for overnight observation ? ?

## 2021-06-22 NOTE — Anesthesia Procedure Notes (Signed)
Date/Time: 06/22/2021 7:20 AM ?Performed by: Demetrius Charity, CRNA ?Pre-anesthesia Checklist: Patient identified, Emergency Drugs available, Suction available, Patient being monitored and Timeout performed ?Patient Re-evaluated:Patient Re-evaluated prior to induction ?Oxygen Delivery Method: Simple face mask ?Induction Type: IV induction ?Placement Confirmation: CO2 detector and positive ETCO2 ? ? ? ? ?

## 2021-06-22 NOTE — Transfer of Care (Signed)
Immediate Anesthesia Transfer of Care Note ? ?Patient: Dillon Singh ? ?Procedure(s) Performed: TOTAL HIP ARTHROPLASTY ANTERIOR APPROACH (Right: Hip) ? ?Patient Location: PACU ? ?Anesthesia Type:Spinal ? ?Level of Consciousness: awake and alert  ? ?Airway & Oxygen Therapy: Patient Spontanous Breathing and Patient connected to face mask oxygen ? ?Post-op Assessment: Report given to RN and Post -op Vital signs reviewed and stable ? ?Post vital signs: Reviewed and stable ? ?Last Vitals:  ?Vitals Value Taken Time  ?BP 97/54   ?Temp 98.3   ?Pulse 70   ?Resp 15   ?SpO2 98   ? ? ?Last Pain:  ?Vitals:  ? 06/22/21 0623  ?PainSc: 4   ?   ? ?  ? ?Complications: No notable events documented. ?

## 2021-06-22 NOTE — H&P (Signed)
? ?Chief Complaint  ?Patient presents with  ? Pre-op Exam  ?Scheduled for Right THA 06/22/21 with Dr. Rudene Christians  ? ? ?History of the Present Illness: ?Dillon Singh is a 61 y.o. male here today for history and physical for right total hip arthroplasty with Dr. Hessie Knows of 03/2021. Patient has had years of progressive right hip pain. He describes lateral hip pain, groin and thigh pain. Pain increased with ambulation and overall limiting his mobility. His pain is moderate. It is severe, unable to walk more than 10 minutes at a time. He ambulates with no assistive devices. Patient occasionally takes Tylenol for pain. ? ?He is unemployed. He is taking care of his parents. His mother has Alzheimer's and father has congestive heart failure. ? ?I have reviewed past medical, surgical, social and family history, and allergies as documented in the EMR. ? ?Past Medical History: ?Past Medical History:  ?Diagnosis Date  ? Acute lymphadenitis  ? Anxiety  ? Chronic back pain  ? Depression  ? Dizziness  ? Erectile dysfunction  ? Fibromyalgia  ? Hyperlipidemia  ? Hypopituitarism (CMS-HCC)  ? Hypothyroidism (acquired)  ? Insomnia  ? Neck mass  ? Skin lesion  ? Tobacco use  ? ?Past Surgical History: ?Past Surgical History:  ?Procedure Laterality Date  ? Left L2-3 far lateral microdiscectomy 05/11/2016  ?Dr Meade Maw  ? APPENDECTOMY  ? APPENDECTOMY  ? finger surgery  ?secondary to trauma  ? 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 REPLACEMENT TOTAL HIP W/ RESURFACING IMPLANTS Left  ? ?Past Family History: ?Family History  ?Problem Relation Age of Onset  ? Diabetes Mother  ? Heart disease Father  ? ?Medications: ?Current Outpatient Medications Ordered in Epic  ?Medication Sig Dispense Refill  ? celecoxib (CELEBREX) 100 MG capsule Take 1 capsule (100 mg total) by mouth 2 (two) times daily 60 capsule 5  ? escitalopram oxalate (LEXAPRO) 10 MG tablet Take 10 mg by mouth once daily  ? levothyroxine (SYNTHROID) 100 MCG tablet Take 100  mcg by mouth once daily  ? rosuvastatin (CRESTOR) 20 MG tablet Take 20 mg by mouth once daily  ? traMADoL (ULTRAM) 50 mg tablet Take 1 tablet (50 mg total) by mouth every 6 (six) hours 60 tablet 1  ? ?No current Epic-ordered facility-administered medications on file.  ? ?Allergies: ?No Known Allergies  ? ?Body mass index is 24.28 kg/m?. ? ?Review of Systems: ?A comprehensive 14 point ROS was performed, reviewed, and the pertinent orthopaedic findings are documented in the HPI. ? ?Vitals:  ?06/14/21 0822  ?BP: 120/62  ? ? ?General Physical Examination:  ?General:  ?Well developed, well nourished, no apparent distress, normal affect, antalgic gait no assistive devices ? ?HEENT: ?Head normocephalic, atraumatic, PERRL.  ? ?Abdomen: ?Soft, non tender, non distended, Bowel sounds present. ? ?Heart: ?Examination of the heart reveals regular, rate, and rhythm. There is no murmur noted on ascultation. There is a normal apical pulse. ? ?Lungs: ?Lungs are clear to auscultation. There is no wheeze, rhonchi, or crackles. There is normal expansion of bilateral chest walls.  ? ?Right hip: ?On exam, the right hip has 10 degrees internal rotation and 35 degrees external rotation. Significant pain with internal rotation.  ? ?Radiographs: ? ?AP pelvis and lateral x-rays of the right hip were reviewed by me today. These show central hip osteoarthritis on the AP view with cyst formation in the head and subchondral sclerosis of the acetabulum. On the lateral view, there is a large cyst in the femoral  head anteriorly as well as a cam lesion. ? ?X-ray Impression: ?Osteoarthritis of the right hip, severe centrally. ? ?Incidentally, the left total hip appears to be in good position with offset and length. ? ?Assessment: ?ICD-10-CM  ?1. Primary osteoarthritis of right hip M16.11  ? ?Plan: ? ?61 year old male with advanced right hip osteoarthritis. Pain progressively increasing and interfering with quality of life and activities daily living.  Risks, benefits, complications of a right total hip arthroplasty have been discussed with the patient. Patient has agreed and consented the procedure with Dr. Hessie Knows on 06/22/2021. ? ?Surgical Risks: ? ?The nature of the condition and the proposed procedure has been reviewed in detail with the patient. Surgical versus non-surgical options and prognosis for recovery have been reviewed and the inherent risks and benefits of each have been discussed including the risks of infection, bleeding, injury to nerves/blood vessels/tendons, incomplete relief of symptoms, persisting pain and/or stiffness, loss of function, complex regional pain syndrome, failure of the procedure, as appropriate. ? ? ?Electronically signed by Feliberto Gottron, [place] at 06/14/2021 8:56 AM EDT ? ?Reviewed  H+P. ?No changes noted. ? ?

## 2021-06-22 NOTE — Anesthesia Procedure Notes (Signed)
Spinal ? ?Patient location during procedure: OR ?Start time: 06/22/2021 7:20 AM ?End time: 06/22/2021 7:25 AM ?Reason for block: surgical anesthesia ?Staffing ?Performed: resident/CRNA  ?Anesthesiologist: Martha Clan, MD ?Resident/CRNA: Demetrius Charity, CRNA ?Preanesthetic Checklist ?Completed: patient identified, IV checked, site marked, risks and benefits discussed, surgical consent, monitors and equipment checked, pre-op evaluation and timeout performed ?Spinal Block ?Patient position: sitting ?Prep: ChloraPrep ?Patient monitoring: heart rate, continuous pulse ox, blood pressure and cardiac monitor ?Approach: midline ?Location: L4-5 ?Injection technique: single-shot ?Needle ?Needle type: Whitacre and Introducer  ?Needle gauge: 24 G ?Needle length: 9 cm ?Assessment ?Sensory level: T10 ?Events: CSF return ?Additional Notes ?Sterile aseptic technique used throughout the procedure.  Negative paresthesia. Negative blood return. Positive free-flowing CSF. Expiration date of kit checked and confirmed. Patient tolerated procedure well, without complications. ? ? ? ? ? ?

## 2021-06-22 NOTE — Evaluation (Signed)
Physical Therapy Evaluation ?Patient Details ?Name: Dillon Singh ?MRN: [id number] ?DOB: 1960-10-17 ?Today's Date: 06/22/2021 ? ?History of Present Illness ? Patient is a 61 year old male with osteoarthritis of right hip s/p elective right THA. History of left THA, lumbar surgery, chronic back pain.  ?Clinical Impression ? Patient is agreeable to PT evaluation. He reports he is independent at baseline with no assistive device and lives with his spouse. He recently had the left hip replaced and has rolling walker and cane in place at home.  ?Patient is moving well after surgery. He required no physical assistance with standing, supervision for safety. He reports right hip pain with movement that decreases with mobility. Therapeutic exercises initiated today. He is hopeful to discharge home tomorrow following therapy session. PT will continue to follow to maximize independence and facilitate return to prior level of function.  ?   ? ?Recommendations for follow up therapy are one component of a multi-disciplinary discharge planning process, led by the attending physician.  Recommendations may be updated based on patient status, additional functional criteria and insurance authorization. ? ?Follow Up Recommendations Home health PT ? ?  ?Assistance Recommended at Discharge Set up Supervision/Assistance  ?Patient can return home with the following ? A little help with walking and/or transfers;A little help with bathing/dressing/bathroom;Assist for transportation ? ?  ?Equipment Recommendations None recommended by PT  ?Recommendations for Other Services ?    ?  ?Functional Status Assessment Patient has had a recent decline in their functional status and demonstrates the ability to make significant improvements in function in a reasonable and predictable amount of time.  ? ?  ?Precautions / Restrictions Precautions ?Precautions: Anterior Hip;Fall ?Precaution Booklet Issued: Yes (comment) ?Restrictions ?Weight Bearing  Restrictions: Yes ?RLE Weight Bearing: Weight bearing as tolerated  ? ?  ? ?Mobility ? Bed Mobility ?Overal bed mobility: Modified Independent ?  ?  ?  ?  ?  ?  ?  ?  ? ?Transfers ?Overall transfer level: Needs assistance ?Equipment used: Rolling walker (2 wheels) ?Transfers: Sit to/from Stand ?Sit to Stand: Supervision ?  ?  ?  ?  ?  ?General transfer comment: verbal cues for hand placement ?  ? ?Ambulation/Gait ?  ?  ?  ?  ?  ?  ?Pre-gait activities: cues for weight shifting in standing with rolling walker for UE support with no loss of balance and no knee buckling noted. ambulation deferred due to hip pain, although patient reports decreased pain with movement ?  ? ?Stairs ?  ?  ?  ?  ?  ? ?Wheelchair Mobility ?  ? ?Modified Rankin (Stroke Patients Only) ?  ? ?  ? ?Balance Overall balance assessment: Needs assistance ?Sitting-balance support: Feet supported ?Sitting balance-Leahy Scale: Good ?  ?  ?Standing balance support: Bilateral upper extremity supported, During functional activity, Reliant on assistive device for balance ?Standing balance-Leahy Scale: Fair ?Standing balance comment: stand by assistance provided for safety with rolling walker for UE support ?  ?  ?  ?  ?  ?  ?  ?  ?  ?  ?  ?   ? ? ? ?Pertinent Vitals/Pain Pain Assessment ?Pain Assessment: 0-10 ?Pain Score: 8  ?Pain Location: right hip ?Pain Descriptors / Indicators: Discomfort, Sore ?Pain Intervention(s): Limited activity within patient's tolerance, Premedicated before session, Monitored during session, Ice applied, Repositioned (he reports decreased pain with mobility)  ? ? ?Home Living Family/patient expects to be discharged to:: Private residence ?Living Arrangements: Spouse/significant other ?  Available Help at Discharge: Family;Available PRN/intermittently ?Type of Home: House ?Home Access: Level entry ?  ?  ?  ?Home Layout: One level ?Home Equipment: Conservation officer, nature (2 wheels);Cane - single point ?   ?  ?Prior Function Prior Level of  Function : Independent/Modified Independent ?  ?  ?  ?  ?  ?  ?  ?  ?  ? ? ?Hand Dominance  ?   ? ?  ?Extremity/Trunk Assessment  ? Upper Extremity Assessment ?Upper Extremity Assessment: Overall WFL for tasks assessed ?  ? ?Lower Extremity Assessment ?Lower Extremity Assessment: RLE deficits/detail;LLE deficits/detail ?RLE Deficits / Details: patient is able to activate hip, knee, ankle movement. weight bearing with assistive deivce and no knee buckling noted ?RLE Sensation: WNL ?LLE Deficits / Details: AROM WFL for functional activity. patient does report he has felt mild pain in hip area after helping lifting a bathtub recently ?  ? ?   ?Communication  ? Communication: No difficulties  ?Cognition Arousal/Alertness: Awake/alert ?Behavior During Therapy: [employer] for tasks assessed/performed ?Overall Cognitive Status: Within Functional Limits for tasks assessed ?  ?  ?  ?  ?  ?  ?  ?  ?  ?  ?  ?  ?  ?  ?  ?  ?General Comments: patient able to follow all commands without difficulty ?  ?  ? ?  ?General Comments   ? ?  ?Exercises General Exercises - Lower Extremity ?Ankle Circles/Pumps: AROM, Strengthening, Right, 10 reps, Supine ?Quad Sets: AROM, Right, Strengthening, 10 reps, Supine ?Heel Slides: AAROM, Strengthening, Right, 10 reps, Supine ?Hip ABduction/ADduction: AAROM, Strengthening, Right, 10 reps, Supine ?Other Exercises ?Other Exercises: verbal and visual cues for exercise technique for strengthening  ? ?Assessment/Plan  ?  ?PT Assessment Patient needs continued PT services  ?PT Problem List Decreased range of motion;Decreased strength;Decreased activity tolerance;Decreased balance;Decreased mobility;Pain ? ?   ?  ?PT Treatment Interventions DME instruction;Gait training;Stair training;Functional mobility training;Therapeutic activities;Therapeutic exercise;Balance training   ? ?PT Goals (Current goals can be found in the Care Plan section)  ?Acute Rehab PT Goals ?Patient Stated Goal: to return home as soon as  possible ?PT Goal Formulation: With patient ?Time For Goal Achievement: 07/06/21 ?Potential to Achieve Goals: Good ? ?  ?Frequency BID ?  ? ? ?Co-evaluation   ?  ?  ?  ?  ? ? ?  ?AM-PAC PT "6 Clicks" Mobility  ?Outcome Measure Help needed turning from your back to your side while in a flat bed without using bedrails?: None ?Help needed moving from lying on your back to sitting on the side of a flat bed without using bedrails?: A Little ?Help needed moving to and from a bed to a chair (including a wheelchair)?: A Little ?Help needed standing up from a chair using your arms (e.g., wheelchair or bedside chair)?: A Little ?Help needed to walk in hospital room?: A Little ?Help needed climbing 3-5 steps with a railing? : A Little ?6 Click Score: 19 ? ?  ?End of Session   ?Activity Tolerance: Patient tolerated treatment well ?Patient left: in bed;with call bell/phone within reach;with bed alarm set;with SCD's reapplied (ice pack applied right hip) ?Nurse Communication: Mobility status ?PT Visit Diagnosis: Other abnormalities of gait and mobility (R26.89);Muscle weakness (generalized) (M62.81);Pain ?Pain - Right/Left: Right ?Pain - part of body: Hip ?  ? ?Time: 0240-9735 ?PT Time Calculation (min) (ACUTE ONLY): 33 min ? ? ?Charges:   PT Evaluation ?$PT Eval Low Complexity: 1 Low ?PT  Treatments ?$Therapeutic Exercise: 8-22 mins ?  ?   ? ? ?Minna Merritts, PT, MPT ? ? ?Percell Locus ?06/22/2021, 3:46 PM ? ?

## 2021-06-23 DIAGNOSIS — M1611 Unilateral primary osteoarthritis, right hip: Secondary | ICD-10-CM | POA: Diagnosis not present

## 2021-06-23 DIAGNOSIS — Z720 Tobacco use: Secondary | ICD-10-CM | POA: Diagnosis not present

## 2021-06-23 DIAGNOSIS — Z79899 Other long term (current) drug therapy: Secondary | ICD-10-CM | POA: Diagnosis not present

## 2021-06-23 DIAGNOSIS — Z96642 Presence of left artificial hip joint: Secondary | ICD-10-CM | POA: Diagnosis not present

## 2021-06-23 DIAGNOSIS — E890 Postprocedural hypothyroidism: Secondary | ICD-10-CM | POA: Diagnosis not present

## 2021-06-23 LAB — CBC
HCT: 36.7 % — ABNORMAL LOW (ref 39.0–52.0)
Hemoglobin: 12.4 g/dL — ABNORMAL LOW (ref 13.0–17.0)
MCH: 28.5 pg (ref 26.0–34.0)
MCHC: 33.8 g/dL (ref 30.0–36.0)
MCV: 84.4 fL (ref 80.0–100.0)
Platelets: 172 10*3/uL (ref 150–400)
RBC: 4.35 MIL/uL (ref 4.22–5.81)
RDW: 13.1 % (ref 11.5–15.5)
WBC: 10.2 10*3/uL (ref 4.0–10.5)
nRBC: 0 % (ref 0.0–0.2)

## 2021-06-23 MED ORDER — BISACODYL 5 MG PO TBEC
5.0000 mg | DELAYED_RELEASE_TABLET | Freq: Every day | ORAL | 0 refills | Status: DC | PRN
Start: 2021-06-23 — End: 2021-11-18

## 2021-06-23 MED ORDER — METHOCARBAMOL 500 MG PO TABS: 500.0000 mg | ORAL_TABLET | Freq: Four times a day (QID) | ORAL | 0 refills | Status: DC | PRN

## 2021-06-23 MED ORDER — TRAMADOL HCL 50 MG PO TABS: 50.0000 mg | ORAL_TABLET | Freq: Four times a day (QID) | ORAL | 0 refills | Status: DC | PRN

## 2021-06-23 MED ORDER — ENOXAPARIN SODIUM 40 MG/0.4ML IJ SOSY: 40.0000 mg | PREFILLED_SYRINGE | INTRAMUSCULAR | 0 refills | Status: DC

## 2021-06-23 MED ORDER — OXYCODONE HCL 5 MG PO TABS: 5.0000 mg | ORAL_TABLET | ORAL | 0 refills | Status: DC | PRN

## 2021-06-23 NOTE — Progress Notes (Signed)
Met with the patient in the room at the bedside ?The patient lives at Home with his wife ?The patient  currently has DME rolling walker and cane ?The patient will  not need additional DME ?They have transportation with his wife ?They can afford their medicine ?They are set [facility] with [facility] for Home health services ? ? ? ?

## 2021-06-23 NOTE — Discharge Summary (Signed)
?Physician Discharge Summary  ?Patient ID: ?Dillon Singh ?MRN: [id number] ?DOB/AGE: 06-12-1960 61 y.o. ? ?Admit date: 06/22/2021 ?Discharge date: 06/23/2021 ? ?Admission Diagnoses:  ?Status post total hip replacement, right [Z96.641] ? ? ?Discharge Diagnoses: ?Patient Active Problem List  ? Diagnosis Date Noted  ? Status post total hip replacement, right 06/22/2021  ? Status post total hip replacement, left 11/03/2020  ? Lumbar back pain 08/01/2013  ? Viral syndrome 03/21/2013  ? Anxiety 11/17/2011  ? General medical examination 03/30/2011  ? Skin lesion 09/13/2010  ? HYPERLIPIDEMIA 08/12/2009  ? ERECTILE DYSFUNCTION 08/15/2008  ? PAIN IN JOINT, MULTIPLE SITES 08/15/2008  ? DEPRESSIVE DISORDER 07/17/2008  ? FIBROMYALGIA 07/17/2008  ? RASH-NONVESICULAR 07/17/2008  ? INSOMNIA-SLEEP DISORDER-UNSPEC 06/17/2008  ? TOBACCO USE 04/03/2008  ? NECK MASS 03/11/2008  ? ACUTE LYMPHADENITIS 02/26/2008  ? HYPOTHYROIDISM 02/04/2008  ? 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  ? BACK PAIN, CHRONIC 02/04/2008  ? DIZZINESS 02/04/2008  ? ? ?Past Medical History:  ?Diagnosis Date  ? Anxiety   ? Arthritis   ? Back pain   ? Erectile dysfunction   ? Fibromyalgia   ? GERD (gastroesophageal reflux disease)   ? Headache   ? Hip pain   ? Hyperlipidemia   ? Hypopituitarism ([place])   ? Hypothyroidism   ? Pneumonia   ? Substance abuse ([place])   ? ?  ?Transfusion: none ?  ?Consultants (if any):  ? ?Discharged Condition: Improved ? ?Hospital Course: Dillon Singh is an 61 y.o. male who was admitted 06/22/2021 with a diagnosis of Status post total hip replacement, right and went to the operating room on 06/22/2021 and underwent the above named procedures.  ?  ?Surgeries: Procedure(s): ?TOTAL HIP ARTHROPLASTY ANTERIOR APPROACH on 06/22/2021 ?Patient tolerated the surgery well. Taken to PACU where she was stabilized and then transferred to the orthopedic floor. ? ?Started on Lovenox 40 mg q 24 hrs. Foot pumps applied bilaterally at 80 mm. Heels elevated on  bed with rolled towels. No evidence of DVT. Negative Homan. ?Physical therapy started on day #1 for gait training and transfer. OT started day #1 for ADL and assisted devices. ? ?Patient's foley was d/c on day #1. Patient's IV was d/c on day #1. ? ?On post op day #1 patient was stable and ready for discharge to home with HHPT. ? ?He was given perioperative antibiotics:  ?Anti-infectives (From admission, onward)  ? ? Start     Dose/Rate Route Frequency Ordered Stop  ? 06/22/21 1400  ceFAZolin (ANCEF) IVPB 2g/100 mL premix       ? 2 g ?200 mL/hr over 30 Minutes Intravenous Every 6 hours 06/22/21 1249 06/22/21 2002  ? 06/22/21 0619  ceFAZolin (ANCEF) 2-4 GM/100ML-% IVPB       ?Note to Pharmacy: Lyman Bishop T: cabinet override  ?    06/22/21 [phone] 06/22/21 0728  ? 06/22/21 0600  ceFAZolin (ANCEF) IVPB 2g/100 mL premix       ? 2 g ?200 mL/hr over 30 Minutes Intravenous On call to O.R. 06/22/21 0104 06/22/21 0743  ? ?  ?. ? ?He was given sequential compression devices, early ambulation, and Lovenox TEDs for DVT prophylaxis. ? ?He benefited maximally from the hospital stay and there were no complications.   ? ?Recent vital signs:  ?Vitals:  ? 06/23/21 0753 06/23/21 0950  ?BP: 107/84 131/71  ?Pulse: 80   ?Resp: 18   ?Temp: 98.2 ?F (36.8 ?C)   ?SpO2: 97%   ? ? ?Recent laboratory studies:  ?  Lab Results  ?Component Value Date  ? HGB 12.4 (L) 06/23/2021  ? HGB 13.0 06/22/2021  ? HGB 14.7 06/10/2021  ? ?Lab Results  ?Component Value Date  ? WBC 10.2 06/23/2021  ? PLT 172 06/23/2021  ? ?Lab Results  ?Component Value Date  ? INR 0.92 05/09/2016  ? ?Lab Results  ?Component Value Date  ? NA 138 06/10/2021  ? K 4.4 06/10/2021  ? CL 105 06/10/2021  ? CO2 29 06/10/2021  ? BUN 16 06/10/2021  ? CREATININE 1.04 06/22/2021  ? GLUCOSE 100 (H) 06/10/2021  ? ? ?Discharge Medications:   ?Allergies as of 06/23/2021   ?No Known Allergies ?  ? ?  ?Medication List  ?  ? ?STOP taking these medications   ? ?docusate sodium 100 MG capsule ?Commonly  known as: COLACE ?  ?triamcinolone cream 0.1 % ?Commonly known as: KENALOG ?  ? ?  ? ?TAKE these medications   ? ?acetaminophen 500 MG tablet ?Commonly known as: TYLENOL ?Take 1,000 mg by mouth every 6 (six) hours as needed for moderate pain or mild pain. ?  ?bisacodyl 5 MG EC tablet ?Commonly known as: DULCOLAX ?Take 1 tablet (5 mg total) by mouth daily as needed for moderate constipation. ?  ?celecoxib 100 MG capsule ?Commonly known as: CELEBREX ?Take 100 mg by mouth in the morning and at bedtime. ?  ?enoxaparin 40 MG/0.4ML injection ?Commonly known as: LOVENOX ?Inject 0.4 mLs (40 mg total) into the skin daily for 14 days. ?Start taking on: Jun 24, 2021 ?  ?escitalopram 20 MG tablet ?Commonly known as: LEXAPRO ?Take 20 mg by mouth in the morning. ?  ?levothyroxine 100 MCG tablet ?Commonly known as: SYNTHROID ?Take 100 mcg by mouth daily before breakfast. ?  ?methocarbamol 500 MG tablet ?Commonly known as: ROBAXIN ?Take 1 tablet (500 mg total) by mouth every 6 (six) hours as needed for muscle spasms. ?  ?oxyCODONE 5 MG immediate release tablet ?Commonly known as: Oxy IR/ROXICODONE ?Take 1-2 tablets (5-10 mg total) by mouth every 4 (four) hours as needed for moderate pain (pain score 4-6). ?  ?rosuvastatin 20 MG tablet ?Commonly known as: CRESTOR ?Take 20-40 mg by mouth See admin instructions. Take 2 tablets (40 mg) by mouth on Mondays & Thursdays in the evening. Take 1 tablet (20 mg) by mouth on Sundays, Tuesdays, Wednesdays, Fridays & Saturdays in the evening. ?  ?traMADol 50 MG tablet ?Commonly known as: ULTRAM ?Take 1 tablet (50 mg total) by mouth every 6 (six) hours as needed. ?  ? ?  ? ? ?Diagnostic Studies: DG C-Arm 1-60 Min-No Report ? ?Result Date: 06/22/2021 ?CLINICAL DATA:  Right hip replacement EXAM: DG HIP (WITH OR WITHOUT PELVIS) 1V RIGHT; DG C-ARM 1-60 MIN-NO REPORT COMPARISON:  07/29/2020 FINDINGS: 3 C-arm fluoroscopic images were obtained intraoperatively and submitted for post operative  interpretation. Images demonstrate placement of right total hip arthroplasty hardware. 5 seconds fluoroscopy time was utilized. Please see the performing provider's procedural report for further detail. IMPRESSION: As above. Electronically Signed   By: Davina Poke D.O.   On: 06/22/2021 08:42  ? ?DG HIP UNILAT WITH PELVIS 1V RIGHT ? ?Result Date: 06/22/2021 ?CLINICAL DATA:  Right hip replacement EXAM: DG HIP (WITH OR WITHOUT PELVIS) 1V RIGHT; DG C-ARM 1-60 MIN-NO REPORT COMPARISON:  07/29/2020 FINDINGS: 3 C-arm fluoroscopic images were obtained intraoperatively and submitted for post operative interpretation. Images demonstrate placement of right total hip arthroplasty hardware. 5 seconds fluoroscopy time was utilized. Please see the performing provider's  procedural report for further detail. IMPRESSION: As above. Electronically Signed   By: Davina Poke D.O.   On: 06/22/2021 08:42  ? ?DG HIP UNILAT W OR W/O PELVIS 2-3 VIEWS RIGHT ? ?Result Date: 06/22/2021 ?CLINICAL DATA:  Total right hip replacement EXAM: DG HIP (WITH OR WITHOUT PELVIS) 2-3V RIGHT COMPARISON:  Right hip MRI 07/29/2020 FINDINGS: Postsurgical changes of right hip arthroplasty. Normal alignment without evidence of loosening or periprosthetic fracture. Expected soft tissue changes. IMPRESSION: Right hip arthroplasty. No evidence of immediate hardware complication. Electronically Signed   By: Maurine Simmering M.D.   On: 06/22/2021 09:28   ? ?Disposition:  ? ? ? ? Follow-up Information   ? ? Duanne Guess, PA-C Follow up in 2 week(s).   ?Specialties: Orthopedic Surgery, Emergency Medicine ?Contact information: ?[place] [address] ?[phone] ? ? ?  ?  ? ?  ?  ? ?  ? ? ? ?Signed: ?Dorise Hiss CHRISTOPHER ?06/23/2021, 11:34 AM ? ? ?  ?

## 2021-06-23 NOTE — Discharge Instructions (Signed)
?ANTERIOR APPROACH TOTAL HIP REPLACEMENT POSTOPERATIVE DIRECTIONS ? ? ?Hip Rehabilitation, Guidelines Following Surgery  ?The results of a hip operation are greatly improved after range of motion and muscle strengthening exercises. Follow all safety measures which are given to protect your hip. If any of these exercises cause increased pain or swelling in your joint, decrease the amount until you are comfortable again. Then slowly increase the exercises. Call your caregiver if you have problems or questions.  ? ?HOME CARE INSTRUCTIONS  ?Remove items at home which could result in a fall. This includes throw rugs or furniture in walking pathways.  ?ICE to the affected hip every three hours for 30 minutes at a time and then as needed for pain and swelling.  Continue to use ice on the hip for pain and swelling from surgery. You may notice swelling that will progress down to the foot and ankle.  This is normal after surgery.  Elevate the leg when you are not up walking on it.   ?Continue to use the breathing machine which will help keep your temperature down.  It is common for your temperature to cycle up and down following surgery, especially at night when you are not up moving around and exerting yourself.  The breathing machine keeps your lungs expanded and your temperature down. ?Do not place pillow under knee, focus on keeping the knee straight while resting ? ?DIET ?You may resume your previous home diet once your are discharged from the hospital. ? ?DRESSING / WOUND CARE / SHOWERING ?Please remove provena negative pressure dressing on 06/30/2021 and apply honey comb dressing. Keep dressing clean and dry at all times.  ? ?ACTIVITY ?Walk with your walker as instructed. ?Use walker as long as suggested by your caregivers. ?Avoid periods of inactivity such as sitting longer than an hour when not asleep. This helps prevent blood clots.  ?You may resume a sexual relationship in one month or when given the OK by your  doctor.  ?You may return to work once you are cleared by your doctor.  ?Do not drive a car for 6 weeks or until released by you surgeon.  ?Do not drive while taking narcotics. ? ?WEIGHT BEARING ?Weight bearing as tolerated. Use walker/cane as needed for at least 4 weeks post op. ? ?POSTOPERATIVE CONSTIPATION PROTOCOL ?Constipation - defined medically as fewer than three stools per week and severe constipation as less than one stool per week. ? ?One of the most common issues patients have following surgery is constipation.  Even if you have a regular bowel pattern at home, your normal regimen is likely to be disrupted due to multiple reasons following surgery.  Combination of anesthesia, postoperative narcotics, change in appetite and fluid intake all can affect your bowels.  In order to avoid complications following surgery, here are some recommendations in order to help you during your recovery period. ? ?Colace (docusate) - Pick up an over-the-counter form of Colace or another stool softener and take twice a day as long as you are requiring postoperative pain medications.  Take with a full glass of water daily.  If you experience loose stools or diarrhea, hold the colace until you stool forms back up.  If your symptoms do not get better within 1 week or if they get worse, check with your doctor. ? ?Dulcolax (bisacodyl) - Pick up over-the-counter and take as directed by the product packaging as needed to assist with the movement of your bowels.  Take with a full glass of   water.  Use this product as needed if not relieved by Colace only.  ? ?MiraLax (polyethylene glycol) - Pick up over-the-counter to have on hand.  MiraLax is a solution that will increase the amount of water in your bowels to assist with bowel movements.  Take as directed and can mix with a glass of water, juice, soda, coffee, or tea.  Take if you go more than two days without a movement. ?Do not use MiraLax more than once per day. Call your doctor  if you are still constipated or irregular after using this medication for 7 days in a row. ? ?If you continue to have problems with postoperative constipation, please contact the office for further assistance and recommendations.  If you experience "the worst abdominal pain ever" or develop nausea or vomiting, please contact the office immediatly for further recommendations for treatment. ? ?ITCHING ? If you experience itching with your medications, try taking only a single pain pill, or even half a pain pill at a time.  You can also use Benadryl over the counter for itching or also to help with sleep.  ? ?TED HOSE STOCKINGS ?Wear the elastic stockings on both legs for six weeks following surgery during the day but you may remove then at night for sleeping. ? ?MEDICATIONS ?See your medication summary on the ?After Visit Summary? that the nursing staff will review with you prior to discharge.  You may have some home medications which will be placed on hold until you complete the course of blood thinner medication.  It is important for you to complete the blood thinner medication as prescribed by your surgeon.  Continue your approved medications as instructed at time of discharge. ? ?PRECAUTIONS ?If you experience chest pain or shortness of breath - call 911 immediately for transfer to the hospital emergency department.  ?If you develop a fever greater that 101 F, purulent drainage from wound, increased redness or drainage from wound, foul odor from the wound/dressing, or calf pain - CONTACT YOUR SURGEON.   ?                                                ?FOLLOW-UP APPOINTMENTS ?Make sure you keep all of your appointments after your operation with your surgeon and caregivers. You should call the office at the above phone number and make an appointment for approximately two weeks after the date of your surgery or on the date instructed by your surgeon outlined in the "After Visit Summary". ? ?RANGE OF MOTION AND  STRENGTHENING EXERCISES  ?These exercises are designed to help you keep full movement of your hip joint. Follow your caregiver's or physical therapist's instructions. Perform all exercises about fifteen times, three times per day or as directed. Exercise both hips, even if you have had only one joint replacement. These exercises can be done on a training (exercise) mat, on the floor, on a table or on a bed. Use whatever works the best and is most comfortable for you. Use music or television while you are exercising so that the exercises are a pleasant break in your day. This will make your life better with the exercises acting as a break in routine you can look forward to.  ?Lying on your back, slowly slide your foot toward your buttocks, raising your knee up off the floor. Then slowly slide your   foot back down until your leg is straight again.  ?Lying on your back spread your legs as far apart as you can without causing discomfort.  ?Lying on your side, raise your upper leg and foot straight up from the floor as far as is comfortable. Slowly lower the leg and repeat.  ?Lying on your back, tighten up the muscle in the front of your thigh (quadriceps muscles). You can do this by keeping your leg straight and trying to raise your heel off the floor. This helps strengthen the largest muscle supporting your knee.  ?Lying on your back, tighten up the muscles of your buttocks both with the legs straight and with the knee bent at a comfortable angle while keeping your heel on the floor.  ? ?IF YOU ARE TRANSFERRED TO A SKILLED REHAB FACILITY ?If the patient is transferred to a skilled rehab facility following release from the hospital, a list of the current medications will be sent to the facility for the patient to continue.  When discharged from the skilled rehab facility, please have the facility set up the patient's Home Health Physical Therapy prior to being released. Also, the skilled facility will be responsible for  providing the patient with their medications at time of release from the facility to include their pain medication, the muscle relaxants, and their blood thinner medication. If the patient is still at the reh

## 2021-06-23 NOTE — Progress Notes (Signed)
Physical Therapy Treatment ?Patient Details ?Name: Dillon Singh ?MRN: [id number] ?DOB: Jul 07, 1960 ?Today's Date: 06/23/2021 ? ? ?History of Present Illness Patient is a 61 year old male with osteoarthritis of right hip s/p elective right THA. History of left THA, lumbar surgery, chronic back pain. ? ?  ?PT Comments  ? ? Pt was sitting in recliner upon arriving, he is A and O x 4 and eager to DC home." I'll do whatever I need to do to get home today." He required no physical assistance to stand, ambulate or perform ascending/descending step to simulate home entry. Author educated pt on importance of activity and performing there ex handout. Pt is cleared from an acute PT standpoint for safe DC home with HHPT to follow. ?  ?Recommendations for follow up therapy are one component of a multi-disciplinary discharge planning process, led by the attending physician.  Recommendations may be updated based on patient status, additional functional criteria and insurance authorization. ? ?Follow Up Recommendations ? Home health PT ?  ?  ?Assistance Recommended at Discharge Set up Supervision/Assistance  ?Patient can return home with the following A little help with walking and/or transfers;A little help with bathing/dressing/bathroom;Assist for transportation ?  ?Equipment Recommendations ? None recommended by PT  ?  ?   ?Precautions / Restrictions Precautions ?Precautions: Anterior Hip;Fall ?Precaution Booklet Issued: Yes (comment) ?Restrictions ?Weight Bearing Restrictions: Yes ?RLE Weight Bearing: Weight bearing as tolerated  ?  ? ?Mobility ? Bed Mobility ?   ?General bed mobility comments: in recliner pre/post session ?  ? ?Transfers ?Overall transfer level: Needs assistance ?Equipment used: Rolling walker (2 wheels) ?Transfers: Sit to/from Stand ?Sit to Stand: Supervision ?  ?  ?Ambulation/Gait ?Ambulation/Gait assistance: Supervision ?Gait Distance (Feet): 60 Feet ?Assistive device: Rolling walker (2 wheels) ?Gait  Pattern/deviations: Step-to pattern, Antalgic ?Gait velocity: decreased ?  ?  ?General Gait Details: distance l;imited by pain. no physical assistance or safety concerns with gait. ? ? ?Stairs ?Stairs: Yes ?Stairs assistance: Supervision ?Stair Management: No rails, With walker, Step to pattern ?Number of Stairs: 1 ?General stair comments: pt was easily and safely able to ascend/descend 1 step with RW + supervision. Pt only has one step/threshold to enter home. ?  ?Balance Overall balance assessment: Needs assistance ?Sitting-balance support: Feet supported ?Sitting balance-Leahy Scale: Good ?  ?  ?Standing balance support: Bilateral upper extremity supported, During functional activity, Reliant on assistive device for balance ?Standing balance-Leahy Scale: Good ?  ?  ?  ?Cognition Arousal/Alertness: Awake/alert ?Behavior During Therapy: [facility] for tasks assessed/performed ?Overall Cognitive Status: Within Functional Limits for tasks assessed ?  ?   ?General Comments: Pt is A and O x 4 ?  ?  ? ?  ?   ?General Comments General comments (skin integrity, edema, etc.): revieqwed importance of performing ther ex throughout the day and routine ambulation. ?  ?  ? ?Pertinent Vitals/Pain Pain Assessment ?Pain Assessment: 0-10 ?Pain Score: 7  ?Pain Location: right hip ?Pain Descriptors / Indicators: Discomfort, Sore ?Pain Intervention(s): Limited activity within patient's tolerance, Monitored during session, Premedicated before session, Repositioned, Ice applied  ? ? ?Home Living Family/patient expects to be discharged to:: Private residence ?Living Arrangements: Spouse/significant other ?Available Help at Discharge: Family;Available PRN/intermittently ?Type of Home: House ?Home Access: Level entry ?  ?  ?  ?Home Layout: One level ?Home Equipment: Conservation officer, nature (2 wheels);Cane - single point ?   ?  ?   ? ?PT Goals (current goals can now be found in the care  plan section) Acute Rehab PT Goals ?Patient Stated Goal: to return  home as soon as possible ?Progress towards PT goals: Progressing toward goals ? ?  ?Frequency ? ? ? BID ? ? ? ?  ?PT Plan Current plan remains appropriate  ? ? ?   ?AM-PAC PT "6 Clicks" Mobility   ?Outcome Measure ? Help needed turning from your back to your side while in a flat bed without using bedrails?: None ?Help needed moving from lying on your back to sitting on the side of a flat bed without using bedrails?: A Little ?Help needed moving to and from a bed to a chair (including a wheelchair)?: A Little ?Help needed standing up from a chair using your arms (e.g., wheelchair or bedside chair)?: A Little ?Help needed to walk in hospital room?: A Little ?Help needed climbing 3-5 steps with a railing? : A Little ?6 Click Score: 19 ? ?  ?End of Session   ?Activity Tolerance: Patient tolerated treatment well ?Patient left: in chair;with call bell/phone within reach;Other (comment) (OT in room post PT session) ?Nurse Communication: Mobility status ?PT Visit Diagnosis: Other abnormalities of gait and mobility (R26.89);Muscle weakness (generalized) (M62.81);Pain ?Pain - Right/Left: Right ?Pain - part of body: Hip ?  ? ? ?Time: 4742-5956 ?PT Time Calculation (min) (ACUTE ONLY): 14 min ? ?Charges:  $Therapeutic Activity: 8-22 mins          ?          ?Julaine Fusi PTA ?06/23/21, 10:55 AM  ? ?

## 2021-06-23 NOTE — Progress Notes (Signed)
? ?  Subjective: ?1 Day Post-Op Procedure(s) (LRB): ?TOTAL HIP ARTHROPLASTY ANTERIOR APPROACH (Right) ?Patient reports pain as moderate and severe.   ?Patient is well, and has had no acute complaints or problems ?Denies any CP, SOB, ABD pain. ?We will continue therapy today.  ?Plan is to go Home after hospital stay. ? ?Objective: ?Vital signs in last 24 hours: ?Temp:  [97.6 ?F (36.4 ?C)-98.4 ?F (36.9 ?C)] 98.2 ?F (36.8 ?C) (05/03 0753) ?Pulse Rate:  [51-80] 80 (05/03 0753) ?Resp:  [14-20] 18 (05/03 0753) ?BP: (97-135)/(42-84) 107/84 (05/03 0753) ?SpO2:  [93 %-99 %] 97 % (05/03 0753) ? ?Intake/Output from previous day: ?05/02 0701 - 05/03 0700 ?In: 2700 [P.O.:600; I.V.:2000; IV Piggyback:100] ?Out: 1550 [Urine:1500; Blood:50] ?Intake/Output this shift: ?No intake/output data recorded. ? ?Recent Labs  ?  06/22/21 ?1310 06/23/21 ?0409  ?HGB 13.0 12.4*  ? ?Recent Labs  ?  06/22/21 ?1310 06/23/21 ?0409  ?WBC 15.3* 10.2  ?RBC 4.51 4.35  ?HCT 38.9* 36.7*  ?PLT 191 172  ? ?Recent Labs  ?  06/22/21 ?1310  ?CREATININE 1.04  ? ?No results for input(s): LABPT, INR in the last 72 hours. ? ?EXAM ?General - Patient is Alert, Appropriate, and Oriented ?Extremity - Neurovascular intact ?Sensation intact distally ?Intact pulses distally ?Dorsiflexion/Plantar flexion intact ?Dressing - dressing C/D/I and no drainage, provena intact with out drainage ?Motor Function - intact, moving foot and toes well on exam.  ? ?Past Medical History:  ?Diagnosis Date  ? Anxiety   ? Arthritis   ? Back pain   ? Erectile dysfunction   ? Fibromyalgia   ? GERD (gastroesophageal reflux disease)   ? Headache   ? Hip pain   ? Hyperlipidemia   ? Hypopituitarism ([place])   ? Hypothyroidism   ? Pneumonia   ? Substance abuse ([place])   ? ? ?Assessment/Plan:   ?1 Day Post-Op Procedure(s) (LRB): ?TOTAL HIP ARTHROPLASTY ANTERIOR APPROACH (Right) ?Principal Problem: ?  Status post total hip replacement, right ? ?Estimated body mass index is 23.68 kg/m? as calculated from  the following: ?  Height as of this encounter: '5\' 10"'$  (1.778 m). ?  Weight as of this encounter: 74.8 kg. ?Advance diet ?Up with therapy ?Pain moderate, continue with current pain regimen ?VSS ?Labs stable ?CM to assist with discharge to home with HHPT  ? ?DVT Prophylaxis - Lovenox, TED hose, and SCDs ?Weight-Bearing as tolerated to right leg ? ? ?T. Rachelle Hora, PA-C ?[place] ?06/23/2021, 8:22 AM ?  ?

## 2021-06-23 NOTE — Evaluation (Signed)
Occupational Therapy Evaluation ?Patient Details ?Name: Dillon Singh ?MRN: [id number] ?DOB: [birth year] ?Today's Date: 06/23/2021 ? ? ?History of Present Illness Patient is a 61 year old male with osteoarthritis of right hip s/p elective right THA. History of left THA, lumbar surgery, chronic back pain.  ? ?Clinical Impression ?  ?Pt seen for OT evaluation this date, POD#1 from above surgery. Pt was independent in all ADLs prior to surgery, prior L THA. Pt is eager to return to PLOF with less pain and improved safety and independence. Pt currently requires MAX A assist for LB dressing while in seated position due to pain.  Pt instructed in precautions, self care skills, falls prevention strategies, home/routines modifications, DME/AE for LB bathing and dressing tasks, compression stocking mgt. STS with supervision, amb with RW to bathroom with CGA-supervision to bathroom with grooming tasks completed with supervision at sink level. Pt would benefit from additional instruction in self care skills and techniques to help maintain precautions with or without assistive devices to support recall and carryover prior to discharge. No further OT recommended upon discharge, OT will continue to follow acutely. ?   ? ?Recommendations for follow up therapy are one component of a multi-disciplinary discharge planning process, led by the attending physician.  Recommendations may be updated based on patient status, additional functional criteria and insurance authorization.  ? ?Follow Up Recommendations ? No OT follow up  ?  ?Assistance Recommended at Discharge Intermittent Supervision/Assistance  ?Patient can return home with the following A little help with bathing/dressing/bathroom;Assistance with cooking/housework;Assist for transportation ? ?  ?Functional Status Assessment ? Patient has had a recent decline in their functional status and demonstrates the ability to make significant improvements in function in a reasonable and  predictable amount of time.  ?Equipment Recommendations ? BSC/3in1  ?  ?Recommendations for Other Services   ? ? ?  ?Precautions / Restrictions Precautions ?Precautions: Anterior Hip;Fall ?Restrictions ?Weight Bearing Restrictions: Yes ?RLE Weight Bearing: Weight bearing as tolerated  ? ?  ? ?Mobility Bed Mobility ?  ?  ?  ?  ?  ?  ?  ?General bed mobility comments: NT pt in recliner ?  ? ?Transfers ?Overall transfer level: Needs assistance ?Equipment used: Rolling walker (2 wheels) ?Transfers: Sit to/from Stand ?Sit to Stand: Supervision ?  ?  ?  ?  ?  ?General transfer comment: vcs for hand placement ?  ? ?  ?Balance Overall balance assessment: Needs assistance ?Sitting-balance support: Feet supported ?Sitting balance-Leahy Scale: Good ?  ?  ?Standing balance support: Bilateral upper extremity supported, During functional activity, Reliant on assistive device for balance ?Standing balance-Leahy Scale: Fair ?  ?  ?  ?  ?  ?  ?  ?  ?  ?  ?  ?  ?   ? ?ADL either performed or assessed with clinical judgement  ? ?ADL Overall ADL's : Needs assistance/impaired ?Eating/Feeding: Set up;Independent;Sitting ?  ?Grooming: Wash/dry hands;Wash/dry face;Oral care;Standing;Supervision/safety ?Grooming Details (indicate cue type and reason): sink level with RW ?  ?  ?  ?  ?Upper Body Dressing : Set up;Standing ?Upper Body Dressing Details (indicate cue type and reason): gown ?Lower Body Dressing: Maximal assistance ?Lower Body Dressing Details (indicate cue type and reason): socks, edu on AE pt familiar from previous hip replacement ?Toilet Transfer: Supervision/safety;Rolling walker (2 wheels);Ambulation ?Toilet Transfer Details (indicate cue type and reason): simulated to bedside chair amb from bathroom ?  ?  ?  ?  ?Functional mobility during ADLs: Supervision/safety;Min guard;Rolling  walker (2 wheels) (within room, to bathroom) ?   ? ? ? ?Vision Patient Visual Report: No change from baseline ?   ?   ?Perception   ?  ?Praxis   ?   ? ?Pertinent Vitals/Pain Pain Assessment ?Pain Assessment: 0-10 ?Pain Score: 6  ?Pain Location: right hip ?Pain Descriptors / Indicators: Discomfort, Sore ?Pain Intervention(s): Limited activity within patient's tolerance, Monitored during session, RN gave pain meds during session, Repositioned  ? ? ? ?Hand Dominance   ?  ?Extremity/Trunk Assessment Upper Extremity Assessment ?Upper Extremity Assessment: Overall WFL for tasks assessed ?  ?Lower Extremity Assessment ?Lower Extremity Assessment: RLE deficits/detail ?RLE Deficits / Details: s/p R THA ?  ?  ?  ?Communication Communication ?Communication: No difficulties ?  ?Cognition Arousal/Alertness: Awake/alert ?Behavior During Therapy: [facility] for tasks assessed/performed ?Overall Cognitive Status: Within Functional Limits for tasks assessed ?  ?  ?  ?  ?  ?  ?  ?  ?  ?  ?  ?  ?  ?  ?  ?  ?  ?  ?  ?General Comments    ? ?  ?Exercises   ?  ?Shoulder Instructions    ? ? ?Home Living Family/patient expects to be discharged to:: Private residence ?Living Arrangements: Spouse/significant other ?Available Help at Discharge: Family;Available PRN/intermittently ?Type of Home: House ?Home Access: Level entry ?  ?  ?Home Layout: One level ?  ?  ?Bathroom Shower/Tub: Tub/shower unit ?  ?  ?  ?  ?Home Equipment: Conservation officer, nature (2 wheels);Cane - single point ?  ?  ?  ? ?  ?Prior Functioning/Environment Prior Level of Function : Independent/Modified Independent ?  ?  ?  ?  ?  ?  ?  ?ADLs Comments: previous L THA ?  ? ?  ?  ?OT Problem List: Decreased activity tolerance;Impaired balance (sitting and/or standing) ?  ?   ?OT Treatment/Interventions: Self-care/ADL training;Patient/family education;Therapeutic activities;DME and/or AE instruction  ?  ?OT Goals(Current goals can be found in the care plan section) Acute Rehab OT Goals ?Patient Stated Goal: go home ?OT Goal Formulation: With patient ?Time For Goal Achievement: 07/07/21 ?Potential to Achieve Goals: Good ?ADL Goals ?Pt Will  Perform Grooming: with modified independence;standing ?Pt Will Perform Upper Body Dressing: with modified independence ?Pt Will Perform Lower Body Dressing: with modified independence;with adaptive equipment;sit to/from stand;sitting/lateral leans ?Pt Will Transfer to Toilet: with modified independence;ambulating;bedside commode ?Pt Will Perform Toileting - Clothing Manipulation and hygiene: with modified independence;sit to/from stand  ?OT Frequency: Min 2X/week ?  ? ?Co-evaluation   ?  ?  ?  ?  ? ?  ?AM-PAC OT "6 Clicks" Daily Activity     ?Outcome Measure Help from another person eating meals?: None ?Help from another person taking care of personal grooming?: None ?Help from another person toileting, which includes using toliet, bedpan, or urinal?: None ?Help from another person bathing (including washing, rinsing, drying)?: A Little ?Help from another person to put on and taking off regular upper body clothing?: None ?Help from another person to put on and taking off regular lower body clothing?: A Little ?6 Click Score: 22 ?  ?End of Session Equipment Utilized During Treatment: Rolling walker (2 wheels) ?Nurse Communication: Mobility status ? ?Activity Tolerance: Patient tolerated treatment well ?Patient left: in chair;with call bell/phone within reach;with chair alarm set ? ?OT Visit Diagnosis: Unsteadiness on feet (R26.81)  ?              ?Time: [id number] ?  OT Time Calculation (min): 24 min ?Charges:  OT General Charges ?$OT Visit: 1 Visit ?OT Evaluation ?$OT Eval Low Complexity: 1 Low ?OT Treatments ?$Self Care/Home Management : 8-22 mins ? ?Shanon Payor, OTD OTR/L  ?06/23/21, 9:59 AM  ?

## 2021-06-23 NOTE — Plan of Care (Signed)
  Problem: Education: Goal: Knowledge of General Education information will improve Description Including pain rating scale, medication(s)/side effects and non-pharmacologic comfort measures Outcome: Progressing   

## 2021-06-24 LAB — SURGICAL PATHOLOGY

## 2021-06-24 NOTE — Anesthesia Postprocedure Evaluation (Signed)
Anesthesia Post Note ? ?Patient: Dillon Singh ? ?Procedure(s) Performed: TOTAL HIP ARTHROPLASTY ANTERIOR APPROACH (Right: Hip) ? ?Patient location during evaluation: Nursing Unit ?Anesthesia Type: Spinal ?Level of consciousness: oriented and awake and alert ?Pain management: pain level controlled ?Vital Signs Assessment: post-procedure vital signs reviewed and stable ?Respiratory status: spontaneous breathing and respiratory function stable ?Cardiovascular status: blood pressure returned to baseline and stable ?Postop Assessment: no headache, no backache, no apparent nausea or vomiting and patient able to bend at knees ?Anesthetic complications: no ? ? ?No notable events documented. ? ? ?Last Vitals: There were no vitals filed for this visit.  ?Last Pain: There were no vitals filed for this visit. ? ?  ?  ?  ?  ?  ?  ? ?Rolla Plate P ? ? ? ? ? ?

## 2021-06-24 NOTE — Addendum Note (Signed)
Addendum  created 06/24/21 1004 by Rolla Plate, CRNA  ? Clinical Note Signed, Delete clinical note  ?  ?

## 2021-06-24 NOTE — Anesthesia Postprocedure Evaluation (Deleted)
Anesthesia Post Note ? ?Patient: Dillon Singh ? ?Procedure(s) Performed: TOTAL HIP ARTHROPLASTY ANTERIOR APPROACH (Right: Hip) ? ?Patient location during evaluation: Other ?Anesthesia Type: Spinal ?Anesthetic complications: no ?Comments: Pt d/c to home 5/3 ? ? ?No notable events documented. ? ? ?Last Vitals:  ?Vitals:  ? 06/23/21 0950 06/23/21 1145  ?BP: 131/71 121/63  ?Pulse:  86  ?Resp:  16  ?Temp:  36.6 ?C  ?SpO2:  95%  ?  ?Last Pain:  ?Vitals:  ? 06/23/21 0939  ?TempSrc:   ?PainSc: 3   ? ? ?  ?  ?  ?  ?  ?  ? ?Rolla Plate P ? ? ? ? ?

## 2021-07-21 DIAGNOSIS — E039 Hypothyroidism, unspecified: Secondary | ICD-10-CM | POA: Diagnosis not present

## 2021-07-21 DIAGNOSIS — N182 Chronic kidney disease, stage 2 (mild): Secondary | ICD-10-CM | POA: Diagnosis not present

## 2021-07-21 DIAGNOSIS — F411 Generalized anxiety disorder: Secondary | ICD-10-CM | POA: Diagnosis not present

## 2021-07-21 DIAGNOSIS — E782 Mixed hyperlipidemia: Secondary | ICD-10-CM | POA: Diagnosis not present

## 2021-08-03 DIAGNOSIS — E039 Hypothyroidism, unspecified: Secondary | ICD-10-CM | POA: Diagnosis not present

## 2021-08-03 DIAGNOSIS — Z125 Encounter for screening for malignant neoplasm of prostate: Secondary | ICD-10-CM | POA: Diagnosis not present

## 2021-08-03 DIAGNOSIS — R972 Elevated prostate specific antigen [PSA]: Secondary | ICD-10-CM | POA: Diagnosis not present

## 2021-08-03 DIAGNOSIS — E782 Mixed hyperlipidemia: Secondary | ICD-10-CM | POA: Diagnosis not present

## 2021-08-03 DIAGNOSIS — N182 Chronic kidney disease, stage 2 (mild): Secondary | ICD-10-CM | POA: Diagnosis not present

## 2021-08-04 DIAGNOSIS — Z96641 Presence of right artificial hip joint: Secondary | ICD-10-CM | POA: Diagnosis not present

## 2021-08-04 DIAGNOSIS — M1611 Unilateral primary osteoarthritis, right hip: Secondary | ICD-10-CM | POA: Diagnosis not present

## 2021-09-06 DIAGNOSIS — E782 Mixed hyperlipidemia: Secondary | ICD-10-CM | POA: Diagnosis not present

## 2021-09-06 DIAGNOSIS — Z0000 Encounter for general adult medical examination without abnormal findings: Secondary | ICD-10-CM | POA: Diagnosis not present

## 2021-09-06 DIAGNOSIS — M545 Low back pain, unspecified: Secondary | ICD-10-CM | POA: Diagnosis not present

## 2021-09-06 DIAGNOSIS — N182 Chronic kidney disease, stage 2 (mild): Secondary | ICD-10-CM | POA: Diagnosis not present

## 2021-09-06 DIAGNOSIS — E039 Hypothyroidism, unspecified: Secondary | ICD-10-CM | POA: Diagnosis not present

## 2021-10-21 DIAGNOSIS — F411 Generalized anxiety disorder: Secondary | ICD-10-CM | POA: Diagnosis not present

## 2021-10-21 DIAGNOSIS — N182 Chronic kidney disease, stage 2 (mild): Secondary | ICD-10-CM | POA: Diagnosis not present

## 2021-10-21 DIAGNOSIS — E039 Hypothyroidism, unspecified: Secondary | ICD-10-CM | POA: Diagnosis not present

## 2021-10-21 DIAGNOSIS — E782 Mixed hyperlipidemia: Secondary | ICD-10-CM | POA: Diagnosis not present

## 2021-11-15 DIAGNOSIS — E236 Other disorders of pituitary gland: Secondary | ICD-10-CM | POA: Diagnosis not present

## 2021-11-15 DIAGNOSIS — N182 Chronic kidney disease, stage 2 (mild): Secondary | ICD-10-CM | POA: Diagnosis not present

## 2021-11-15 DIAGNOSIS — F320 Major depressive disorder, single episode, mild: Secondary | ICD-10-CM | POA: Diagnosis not present

## 2021-11-15 DIAGNOSIS — M545 Low back pain, unspecified: Secondary | ICD-10-CM | POA: Diagnosis not present

## 2021-11-15 DIAGNOSIS — I700 Atherosclerosis of aorta: Secondary | ICD-10-CM | POA: Diagnosis not present

## 2021-11-18 ENCOUNTER — Ambulatory Visit
Admission: RE | Admit: 2021-11-18 | Discharge: 2021-11-18 | Disposition: A | Discharge status: Home / Self Care | Payer: Medicare HMO | Source: Ambulatory Visit | Attending: Orthopedic Surgery | Admitting: Orthopedic Surgery

## 2021-11-18 VITALS — BP 145/81 | HR 89 | Temp 98.4°F | Resp 18 | Ht 70.0 in | Wt 165.0 lb

## 2021-11-18 DIAGNOSIS — Z1152 Encounter for screening for COVID-19: Secondary | ICD-10-CM | POA: Insufficient documentation

## 2021-11-18 DIAGNOSIS — R0981 Nasal congestion: Secondary | ICD-10-CM | POA: Diagnosis not present

## 2021-11-18 DIAGNOSIS — R079 Chest pain, unspecified: Secondary | ICD-10-CM | POA: Diagnosis not present

## 2021-11-18 DIAGNOSIS — B349 Viral infection, unspecified: Secondary | ICD-10-CM | POA: Diagnosis not present

## 2021-11-18 DIAGNOSIS — R109 Unspecified abdominal pain: Secondary | ICD-10-CM | POA: Diagnosis not present

## 2021-11-18 DIAGNOSIS — F17210 Nicotine dependence, cigarettes, uncomplicated: Secondary | ICD-10-CM | POA: Diagnosis not present

## 2021-11-18 DIAGNOSIS — R0602 Shortness of breath: Secondary | ICD-10-CM | POA: Insufficient documentation

## 2021-11-18 LAB — POCT RAPID STREP A (OFFICE): Rapid Strep A Screen: NEGATIVE

## 2021-11-18 LAB — RESP PANEL BY RT-PCR (FLU A&B, COVID) ARPGX2
Influenza A by PCR: NEGATIVE
Influenza B by PCR: NEGATIVE
SARS Coronavirus 2 by RT PCR: NEGATIVE

## 2021-11-18 MED ORDER — PROMETHAZINE-DM 6.25-15 MG/5ML PO SYRP: 5.0000 mL | ORAL_SOLUTION | Freq: Four times a day (QID) | ORAL | 0 refills | Status: DC | PRN

## 2021-11-18 MED ORDER — AZITHROMYCIN 250 MG PO TABS: 250.0000 mg | ORAL_TABLET | Freq: Every day | ORAL | 0 refills | Status: DC

## 2021-11-18 MED ORDER — PREDNISONE 10 MG PO TABS: 10.0000 mg | ORAL_TABLET | Freq: Every day | ORAL | 0 refills | Status: DC

## 2021-11-18 MED ORDER — ALBUTEROL SULFATE HFA 108 (90 BASE) MCG/ACT IN AERS: 1.0000 | INHALATION_SPRAY | Freq: Four times a day (QID) | RESPIRATORY_TRACT | 0 refills | Status: DC | PRN

## 2021-11-18 NOTE — ED Provider Notes (Signed)
[name]    CSN: [id number] Arrival date & time: 11/18/21  1302      History   Chief Complaint Chief Complaint  Patient presents with   Nasal Congestion    Flu or Strep Throat - Entered by patient    HPI AKIEL FENNELL is a 61 y.o. male.  Presents to the urgent care for evaluation of nasal congestion, sore throat, chills, body aches, cough.  Symptoms been present for 3 to 4 days.  He has had subjective fevers.  Not sure if he had a fever last 24 hours.  No pulmonary issues.  He does smoke.  Subtle wheezing.  He has not take any medications for symptoms.  Abdominal pain chest pain shortness of breath.  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   Status post total hip replacement, left 11/03/2020   Lumbar back pain 08/01/2013   Viral syndrome 03/21/2013   Anxiety 11/17/2011   General medical examination 03/30/2011   Skin lesion 09/13/2010   HYPERLIPIDEMIA 08/12/2009   ERECTILE DYSFUNCTION 08/15/2008   PAIN IN JOINT, MULTIPLE SITES 08/15/2008   DEPRESSIVE DISORDER 07/17/2008   FIBROMYALGIA 07/17/2008   RASH-NONVESICULAR 07/17/2008   INSOMNIA-SLEEP DISORDER-UNSPEC 06/17/2008   TOBACCO USE 04/03/2008   NECK MASS 03/11/2008   ACUTE LYMPHADENITIS 02/26/2008   HYPOTHYROIDISM 02/04/2008   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   BACK PAIN, CHRONIC 02/04/2008   DIZZINESS 02/04/2008    Past Surgical History:  Procedure Laterality Date   APPENDECTOMY     COLONOSCOPY     FRACTURE SURGERY Left    hand injury   LUMBAR  LAMINECTOMY/DECOMPRESSION MICRODISCECTOMY Left 05/11/2016   Procedure: LUMBAR LAMINECTOMY/DECOMPRESSION MICRODISCECTOMY L2-3;  Surgeon: Meade Maw, MD;  Location: [facility] ORS;  Service: Neurosurgery;  Laterality: Left;   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 Authorizing Provider  albuterol (VENTOLIN HFA) 108 (90 Base) MCG/ACT inhaler Inhale 1-2 puffs into the lungs every 6 (six) hours as needed for wheezing or shortness of breath. 11/18/21  Yes Duanne Guess, PA-C  azithromycin (ZITHROMAX) 250 MG tablet Take 1 tablet (250 mg total) by mouth daily. Take first 2 tablets together, then 1 every day until finished. 11/18/21  Yes Duanne Guess, PA-C  predniSONE (DELTASONE) 10 MG tablet Take 1 tablet (10 mg total) by mouth daily. 6,5,4,3,2,1 six day taper 11/18/21  Yes Duanne Guess, PA-C  promethazine-dextromethorphan (PROMETHAZINE-DM) 6.25-15 MG/5ML syrup Take 5 mLs by mouth 4 (  four) times daily as needed for cough. 11/18/21  Yes Duanne Guess, PA-C  acetaminophen (TYLENOL) 500 MG tablet Take 1,000 mg by mouth every 6 (six) hours as needed for moderate pain or mild pain.    [provider]  celecoxib (CELEBREX) 100 MG capsule Take 100 mg by mouth in the morning and at bedtime. 08/21/20   [provider]  escitalopram (LEXAPRO) 20 MG tablet Take 20 mg by mouth in the morning. 10/22/18   [provider]  gabapentin (NEURONTIN) 100 MG capsule Take 200 mg by mouth 3 (three) times daily.  11/15/21   [provider]  levothyroxine (SYNTHROID, LEVOTHROID) 100 MCG tablet Take 100 mcg by mouth daily before breakfast.    [provider]  rosuvastatin (CRESTOR) 20 MG tablet Take 20-40 mg by mouth See admin instructions. Take 2 tablets (40 mg) by mouth on Mondays & Thursdays in the evening. Take 1 tablet (20 mg) by mouth on Sundays, Tuesdays, Wednesdays, Fridays & Saturdays in the evening. 08/03/20   [provider]  traMADol (ULTRAM) 50 MG tablet Take 1 tablet (50 mg total) by mouth every 6 (six) hours as needed. 06/23/21   Duanne Guess, PA-C    Family History Family History  Problem Relation Age of Onset   Diabetes Mother     Social History Social History   Tobacco Use   Smoking status: Every Day    Packs/day: 1.00    Types: Cigarettes   Smokeless tobacco: Never  Vaping Use   Vaping Use: Never used  Substance Use Topics   Alcohol use: No   Drug use: Yes    Types: Marijuana    Comment: last smoked 06/10/21     Allergies   Patient has no known allergies.   Review of Systems Review of Systems   Physical Exam Triage Vital Signs ED Triage Vitals  Enc Vitals Group     BP 11/18/21 1310 (!) 145/81     Pulse Rate 11/18/21 1310 89     Resp 11/18/21 1310 18     Temp 11/18/21 1310 98.4 F (36.9 C)     Temp src --      SpO2 11/18/21 1310 98 %     Weight 11/18/21 1312 165 lb (74.8 kg)     Height 11/18/21 1312 '5\' 10"'$  (1.778 m)     Head Circumference --      Peak Flow --      Pain Score 11/18/21 1312 6     Pain Loc --      Pain Edu? --      Excl. in [place]? --    No data found.  Updated Vital Signs BP (!) 145/81   Pulse 89   Temp 98.4 F (36.9 C)   Resp 18   Ht '5\' 10"'$  (1.778 m)   Wt 165 lb (74.8 kg)   SpO2 98%   BMI 23.68 kg/m   Visual Acuity Right Eye Distance:   Left Eye Distance:   Bilateral Distance:    Right Eye Near:   Left Eye Near:    Bilateral Near:     Physical Exam Constitutional:      Appearance: He is  well-developed.  HENT:     Head: Normocephalic and atraumatic.     Right Ear: Tympanic membrane, ear canal and external ear normal.     Left Ear: Tympanic membrane, ear canal and external ear normal.     Nose: Congestion and rhinorrhea present.  Mouth/Throat:     Pharynx: No oropharyngeal exudate or posterior oropharyngeal erythema.  Eyes:     Conjunctiva/sclera: Conjunctivae normal.  Cardiovascular:     Rate and Rhythm: Normal rate.  Pulmonary:     Effort: Pulmonary effort is normal. No respiratory distress.  Abdominal:     General: There is no distension.     Tenderness: There is no abdominal tenderness. There is no guarding.  Musculoskeletal:        General: Normal range of motion.     Cervical back: Normal range of motion.  Skin:    General: Skin is warm.     Findings: No rash.  Neurological:     General: No focal deficit present.     Mental Status: He is alert and oriented to person, place, and time.  Psychiatric:        Behavior: Behavior normal.        Thought Content: Thought content normal.      UC Treatments / Results  Labs (all labs ordered are listed, but only abnormal results are displayed) Labs Reviewed  RESP PANEL BY RT-PCR (FLU A&B, COVID) ARPGX2  POCT RAPID STREP A (OFFICE)    EKG   Radiology No results found.  Procedures Procedures (including critical care time)  Medications Ordered in UC Medications - No data to display  Initial Impression / Assessment and Plan / UC Course  I have reviewed the triage vital signs and the nursing notes.  Pertinent labs & imaging results that were available during my care of the patient were reviewed by me and considered in my medical decision making (see chart for details).     61 year old male with respiratory symptoms.  Patient's symptoms are moderate.  Patient with cough congestion runny nose and sore throat.  No signs of strep on exam.  Some chills and subjective fevers.  Patient currently afebrile  and vital signs are stable.  Subtle wheezing reported but not on exam.  Patient discharged home with prednisone, albuterol, cough suppressant and azithromycin.  He understands signs symptoms return to the urgent care for.  COVID flu test as well as strep test pending. Final Clinical Impressions(s) / UC Diagnoses   Final diagnoses:  Nasal congestion  Viral illness     Discharge Instructions      Please take medications as prescribed.  Return to the urgent care for any shortness of breath, worsening cough, fevers above 102 that are not going down with Tylenol or ibuprofen or any urgent changes in your health.   ED Prescriptions     Medication Sig Dispense Auth. Provider   azithromycin (ZITHROMAX) 250 MG tablet Take 1 tablet (250 mg total) by mouth daily. Take first 2 tablets together, then 1 every day until finished. 6 tablet Duanne Guess, PA-C   predniSONE (DELTASONE) 10 MG tablet Take 1 tablet (10 mg total) by mouth daily. 6,5,4,3,2,1 six day taper 21 tablet Duanne Guess, PA-C   albuterol (VENTOLIN HFA) 108 (90 Base) MCG/ACT inhaler Inhale 1-2 puffs into the lungs every 6 (six) hours as needed for wheezing or shortness of breath. 1 each Duanne Guess, PA-C   promethazine-dextromethorphan (PROMETHAZINE-DM) 6.25-15 MG/5ML syrup Take 5 mLs by mouth 4 (four) times daily as needed for cough. 60 mL Duanne Guess, PA-C      PDMP not reviewed this encounter.   Duanne Guess, [place] 11/18/21 1339

## 2021-11-18 NOTE — ED Triage Notes (Signed)
Patient to Urgent Care with complaints of nasal congestion, sore throat, headache, generalized body aches since Sunday. Reports possible fevers.

## 2021-11-18 NOTE — Discharge Instructions (Signed)
Please take medications as prescribed.  Return to the urgent care for any shortness of breath, worsening cough, fevers above 102 that are not going down with Tylenol or ibuprofen or any urgent changes in your health.

## 2021-11-22 DIAGNOSIS — Z23 Encounter for immunization: Secondary | ICD-10-CM | POA: Diagnosis not present

## 2021-11-22 DIAGNOSIS — E291 Testicular hypofunction: Secondary | ICD-10-CM | POA: Diagnosis not present

## 2021-11-22 DIAGNOSIS — E039 Hypothyroidism, unspecified: Secondary | ICD-10-CM | POA: Diagnosis not present

## 2021-11-22 DIAGNOSIS — E559 Vitamin D deficiency, unspecified: Secondary | ICD-10-CM | POA: Diagnosis not present

## 2021-11-22 DIAGNOSIS — R7301 Impaired fasting glucose: Secondary | ICD-10-CM | POA: Diagnosis not present

## 2021-11-22 DIAGNOSIS — E236 Other disorders of pituitary gland: Secondary | ICD-10-CM | POA: Diagnosis not present

## 2022-01-17 DIAGNOSIS — H524 Presbyopia: Secondary | ICD-10-CM | POA: Diagnosis not present

## 2022-01-27 ENCOUNTER — Ambulatory Visit
Admission: RE | Admit: 2022-01-27 | Discharge: 2022-01-27 | Disposition: A | Discharge status: Home / Self Care | Payer: Medicare HMO | Source: Ambulatory Visit | Attending: Emergency Medicine | Admitting: Emergency Medicine

## 2022-01-27 VITALS — BP 119/74 | HR 75 | Temp 98.2°F | Resp 18 | Ht 70.0 in | Wt 168.0 lb

## 2022-01-27 DIAGNOSIS — R21 Rash and other nonspecific skin eruption: Secondary | ICD-10-CM | POA: Diagnosis not present

## 2022-01-27 MED ORDER — TRIAMCINOLONE ACETONIDE 0.1 % EX CREA: 1.0000 | TOPICAL_CREAM | Freq: Two times a day (BID) | CUTANEOUS | 0 refills | Status: DC

## 2022-01-27 NOTE — Discharge Instructions (Addendum)
Use the steroid cream as directed.  Take Zyrtec or Benadryl for itching.  Follow up with your primary care provider if your symptoms are not improving.

## 2022-01-27 NOTE — ED Provider Notes (Signed)
Dillon Singh    CSN: [id number] Arrival date & time: 01/27/22  1510      History   Chief Complaint Chief Complaint  Patient presents with   Rash    On the shin. - Entered by patient    HPI Dillon Singh is a 61 y.o. male.  Patient presents with a pruritic rash on his right lower leg x 3 months.  Treatment at home with OTC anti-itch cream.  He reports history of similar rash.  No new products, medications, foods.  No fever, chills, wound drainage, numbness, weakness, or other symptoms.  The history is provided by the patient and medical records.    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   Status post total hip replacement, left 11/03/2020   Lumbar back pain 08/01/2013   Viral syndrome 03/21/2013   Anxiety 11/17/2011   General medical examination 03/30/2011   Skin lesion 09/13/2010   HYPERLIPIDEMIA 08/12/2009   ERECTILE DYSFUNCTION 08/15/2008   PAIN IN JOINT, MULTIPLE SITES 08/15/2008   DEPRESSIVE DISORDER 07/17/2008   FIBROMYALGIA 07/17/2008   RASH-NONVESICULAR 07/17/2008   INSOMNIA-SLEEP DISORDER-UNSPEC 06/17/2008   TOBACCO USE 04/03/2008   NECK MASS 03/11/2008   ACUTE LYMPHADENITIS 02/26/2008   HYPOTHYROIDISM 02/04/2008   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   BACK PAIN, CHRONIC 02/04/2008   DIZZINESS 02/04/2008    Past Surgical History:  Procedure Laterality Date   APPENDECTOMY     COLONOSCOPY     FRACTURE SURGERY Left    hand injury   LUMBAR LAMINECTOMY/DECOMPRESSION MICRODISCECTOMY Left 05/11/2016   Procedure: LUMBAR LAMINECTOMY/DECOMPRESSION  MICRODISCECTOMY L2-3;  Surgeon: Meade Maw, MD;  Location: [facility] ORS;  Service: Neurosurgery;  Laterality: Left;   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 Authorizing Provider  triamcinolone cream (KENALOG) 0.1 % Apply 1 Application topically 2 (two) times daily. 01/27/22  Yes Sharion Balloon, NP  acetaminophen (TYLENOL) 500 MG tablet Take 1,000 mg by mouth every 6 (six) hours as needed for moderate pain or mild pain.    [provider]  albuterol (VENTOLIN HFA) 108 (90 Base) MCG/ACT inhaler Inhale 1-2 puffs into the lungs every 6 (six) hours as needed for wheezing or shortness of breath. 11/18/21   Duanne Guess, PA-C  azithromycin (ZITHROMAX) 250 MG tablet Take 1 tablet (250 mg total) by mouth daily. Take first 2 tablets together, then 1 every day until finished. 11/18/21   Duanne Guess, PA-C  celecoxib (CELEBREX) 100 MG capsule Take 100 mg  by mouth in the morning and at bedtime. 08/21/20   [provider]  escitalopram (LEXAPRO) 20 MG tablet Take 20 mg by mouth in the morning. 10/22/18   [provider]  gabapentin (NEURONTIN) 100 MG capsule Take 200 mg by mouth 3 (three) times daily. 11/15/21   [provider]  levothyroxine (SYNTHROID, LEVOTHROID) 100 MCG tablet Take 100 mcg by mouth daily before breakfast.    [provider]  predniSONE (DELTASONE) 10 MG tablet Take 1 tablet (10 mg total) by mouth daily. 6,5,4,3,2,1 six day taper 11/18/21   Duanne Guess, PA-C   promethazine-dextromethorphan (PROMETHAZINE-DM) 6.25-15 MG/5ML syrup Take 5 mLs by mouth 4 (four) times daily as needed for cough. 11/18/21   Duanne Guess, PA-C  rosuvastatin (CRESTOR) 20 MG tablet Take 20-40 mg by mouth See admin instructions. Take 2 tablets (40 mg) by mouth on Mondays & Thursdays in the evening. Take 1 tablet (20 mg) by mouth on Sundays, Tuesdays, Wednesdays, Fridays & Saturdays in the evening. 08/03/20   [provider]  traMADol (ULTRAM) 50 MG tablet Take 1 tablet (50 mg total) by mouth every 6 (six) hours as needed. 06/23/21   Duanne Guess, PA-C    Family History Family History  Problem Relation Age of Onset   Diabetes Mother     Social History Social History   Tobacco Use   Smoking status: Every Day    Packs/day: 1.00    Types: Cigarettes   Smokeless tobacco: Never  Vaping Use   Vaping Use: Never used  Substance Use Topics   Alcohol use: No   Drug use: Yes    Types: Marijuana    Comment: last smoked 06/10/21     Allergies   Patient has no known allergies.   Review of Systems Review of Systems  Constitutional:  Negative for chills and fever.  Musculoskeletal:  Negative for arthralgias, gait problem and joint swelling.  Skin:  Positive for rash. Negative for color change.  Neurological:  Negative for weakness and numbness.  All other systems reviewed and are negative.    Physical Exam Triage Vital Signs ED Triage Vitals [01/27/22 1538]  Enc Vitals Group     BP      Pulse Rate 75     Resp 18     Temp 98.2 F (36.8 C)     Temp src      SpO2 98 %     Weight      Height      Head Circumference      Peak Flow      Pain Score      Pain Loc      Pain Edu?      Excl. in [place]?    No data found.  Updated Vital Signs BP 119/74   Pulse 75   Temp 98.2 F (36.8 C)   Resp 18   Ht '5\' 10"'$  (1.778 m)   Wt 168 lb (76.2 kg)   SpO2 98%   BMI 24.11 kg/m   Visual Acuity Right Eye Distance:   Left Eye Distance:   Bilateral  Distance:    Right Eye Near:   Left Eye Near:    Bilateral Near:     Physical Exam Vitals and nursing note reviewed.  Constitutional:      General: He is not in acute distress.    Appearance: Normal appearance. He is well-developed. He is not ill-appearing.  HENT:     Mouth/Throat:  Mouth: Mucous membranes are moist.  Cardiovascular:     Rate and Rhythm: Normal rate and regular rhythm.     Heart sounds: Normal heart sounds.  Pulmonary:     Effort: Pulmonary effort is normal. No respiratory distress.     Breath sounds: Normal breath sounds.  Musculoskeletal:        General: No swelling. Normal range of motion.     Cervical back: Neck supple.  Skin:    General: Skin is warm and dry.     Capillary Refill: Capillary refill takes less than 2 seconds.     Findings: Rash present.     Comments: Scattered maculopapular rash on right shin. No drainage or erythema.   Neurological:     General: No focal deficit present.     Mental Status: He is alert and oriented to person, place, and time.     Sensory: No sensory deficit.     Motor: No weakness.     Gait: Gait normal.  Psychiatric:        Mood and Affect: Mood normal.        Behavior: Behavior normal.      UC Treatments / Results  Labs (all labs ordered are listed, but only abnormal results are displayed) Labs Reviewed - No data to display  EKG   Radiology No results found.  Procedures Procedures (including critical care time)  Medications Ordered in UC Medications - No data to display  Initial Impression / Assessment and Plan / UC Course  I have reviewed the triage vital signs and the nursing notes.  Pertinent labs & imaging results that were available during my care of the patient were reviewed by me and considered in my medical decision making (see chart for details).    Rash on RLE.  Patient's rash is on his right lower leg only.  He has no other symptoms.  VSS.  Treating with triamcinolone cream.  Benadryl  or Zyrtec as needed for itching.  Instructed patient to follow up with his PCP if his symptoms are not improving.  He agrees to plan of care.    Final Clinical Impressions(s) / UC Diagnoses   Final diagnoses:  Rash     Discharge Instructions      Use the steroid cream as directed.  Take Zyrtec or Benadryl for itching.  Follow up with your primary care provider if your symptoms are not improving.        ED Prescriptions     Medication Sig Dispense Auth. Provider   triamcinolone cream (KENALOG) 0.1 % Apply 1 Application topically 2 (two) times daily. 30 g Sharion Balloon, NP      PDMP not reviewed this encounter.   Sharion Balloon, NP 01/27/22 1556

## 2022-01-27 NOTE — ED Triage Notes (Signed)
Patient to Urgent Care with complaints of rash present to right shin x3 months. Reports area is very itchy.   Reports hx of the same several years ago.

## 2022-02-22 DIAGNOSIS — H33192 Other retinoschisis and retinal cysts, left eye: Secondary | ICD-10-CM | POA: Diagnosis not present

## 2022-02-22 DIAGNOSIS — H2513 Age-related nuclear cataract, bilateral: Secondary | ICD-10-CM | POA: Diagnosis not present

## 2022-03-25 DIAGNOSIS — E559 Vitamin D deficiency, unspecified: Secondary | ICD-10-CM | POA: Diagnosis not present

## 2022-03-25 DIAGNOSIS — R7301 Impaired fasting glucose: Secondary | ICD-10-CM | POA: Diagnosis not present

## 2022-03-25 DIAGNOSIS — E236 Other disorders of pituitary gland: Secondary | ICD-10-CM | POA: Diagnosis not present

## 2022-03-28 DIAGNOSIS — M159 Polyosteoarthritis, unspecified: Secondary | ICD-10-CM | POA: Diagnosis not present

## 2022-03-28 DIAGNOSIS — M47816 Spondylosis without myelopathy or radiculopathy, lumbar region: Secondary | ICD-10-CM | POA: Diagnosis not present

## 2022-03-28 DIAGNOSIS — R768 Other specified abnormal immunological findings in serum: Secondary | ICD-10-CM | POA: Diagnosis not present

## 2022-03-28 DIAGNOSIS — M19079 Primary osteoarthritis, unspecified ankle and foot: Secondary | ICD-10-CM | POA: Diagnosis not present

## 2022-04-01 DIAGNOSIS — I700 Atherosclerosis of aorta: Secondary | ICD-10-CM | POA: Diagnosis not present

## 2022-04-01 DIAGNOSIS — E039 Hypothyroidism, unspecified: Secondary | ICD-10-CM | POA: Diagnosis not present

## 2022-04-01 DIAGNOSIS — E236 Other disorders of pituitary gland: Secondary | ICD-10-CM | POA: Diagnosis not present

## 2022-04-01 DIAGNOSIS — R7301 Impaired fasting glucose: Secondary | ICD-10-CM | POA: Diagnosis not present

## 2022-04-19 ENCOUNTER — Other Ambulatory Visit: Payer: Self-pay | Admitting: Family Medicine

## 2022-04-19 DIAGNOSIS — M5416 Radiculopathy, lumbar region: Secondary | ICD-10-CM

## 2022-04-19 DIAGNOSIS — M47816 Spondylosis without myelopathy or radiculopathy, lumbar region: Secondary | ICD-10-CM | POA: Diagnosis not present

## 2022-04-19 DIAGNOSIS — M4807 Spinal stenosis, lumbosacral region: Secondary | ICD-10-CM | POA: Diagnosis not present

## 2022-04-25 DIAGNOSIS — M545 Low back pain, unspecified: Secondary | ICD-10-CM | POA: Diagnosis not present

## 2022-04-29 DIAGNOSIS — M545 Low back pain, unspecified: Secondary | ICD-10-CM | POA: Diagnosis not present

## 2022-05-02 ENCOUNTER — Ambulatory Visit
Admission: RE | Admit: 2022-05-02 | Discharge: 2022-05-02 | Disposition: A | Discharge status: Home / Self Care | Payer: Medicare HMO | Source: Ambulatory Visit | Attending: Family Medicine | Admitting: Family Medicine

## 2022-05-02 DIAGNOSIS — M5416 Radiculopathy, lumbar region: Secondary | ICD-10-CM

## 2022-05-02 DIAGNOSIS — M545 Low back pain, unspecified: Secondary | ICD-10-CM | POA: Diagnosis not present

## 2022-05-02 DIAGNOSIS — M48061 Spinal stenosis, lumbar region without neurogenic claudication: Secondary | ICD-10-CM | POA: Diagnosis not present

## 2022-05-06 DIAGNOSIS — M47816 Spondylosis without myelopathy or radiculopathy, lumbar region: Secondary | ICD-10-CM | POA: Diagnosis not present

## 2022-05-16 DIAGNOSIS — M7918 Myalgia, other site: Secondary | ICD-10-CM | POA: Diagnosis not present

## 2022-05-16 DIAGNOSIS — M5136 Other intervertebral disc degeneration, lumbar region: Secondary | ICD-10-CM | POA: Diagnosis not present

## 2022-05-16 DIAGNOSIS — M5416 Radiculopathy, lumbar region: Secondary | ICD-10-CM | POA: Diagnosis not present

## 2022-05-16 DIAGNOSIS — M47816 Spondylosis without myelopathy or radiculopathy, lumbar region: Secondary | ICD-10-CM | POA: Diagnosis not present

## 2022-05-16 DIAGNOSIS — M4807 Spinal stenosis, lumbosacral region: Secondary | ICD-10-CM | POA: Diagnosis not present

## 2022-05-22 DIAGNOSIS — E782 Mixed hyperlipidemia: Secondary | ICD-10-CM | POA: Diagnosis not present

## 2022-05-22 DIAGNOSIS — F411 Generalized anxiety disorder: Secondary | ICD-10-CM | POA: Diagnosis not present

## 2022-05-22 DIAGNOSIS — N182 Chronic kidney disease, stage 2 (mild): Secondary | ICD-10-CM | POA: Diagnosis not present

## 2022-05-22 DIAGNOSIS — E039 Hypothyroidism, unspecified: Secondary | ICD-10-CM | POA: Diagnosis not present

## 2022-05-23 DIAGNOSIS — M545 Low back pain, unspecified: Secondary | ICD-10-CM | POA: Diagnosis not present

## 2022-05-23 DIAGNOSIS — M5136 Other intervertebral disc degeneration, lumbar region: Secondary | ICD-10-CM | POA: Diagnosis not present

## 2022-05-31 DIAGNOSIS — M5416 Radiculopathy, lumbar region: Secondary | ICD-10-CM | POA: Diagnosis not present

## 2022-05-31 DIAGNOSIS — M48062 Spinal stenosis, lumbar region with neurogenic claudication: Secondary | ICD-10-CM | POA: Diagnosis not present

## 2022-06-03 DIAGNOSIS — I700 Atherosclerosis of aorta: Secondary | ICD-10-CM | POA: Diagnosis not present

## 2022-06-03 DIAGNOSIS — N182 Chronic kidney disease, stage 2 (mild): Secondary | ICD-10-CM | POA: Diagnosis not present

## 2022-06-03 DIAGNOSIS — E236 Other disorders of pituitary gland: Secondary | ICD-10-CM | POA: Diagnosis not present

## 2022-06-03 DIAGNOSIS — E782 Mixed hyperlipidemia: Secondary | ICD-10-CM | POA: Diagnosis not present

## 2022-06-14 DIAGNOSIS — M461 Sacroiliitis, not elsewhere classified: Secondary | ICD-10-CM | POA: Diagnosis not present

## 2022-06-14 DIAGNOSIS — M5136 Other intervertebral disc degeneration, lumbar region: Secondary | ICD-10-CM | POA: Diagnosis not present

## 2022-06-14 DIAGNOSIS — M4807 Spinal stenosis, lumbosacral region: Secondary | ICD-10-CM | POA: Diagnosis not present

## 2022-06-14 DIAGNOSIS — M5416 Radiculopathy, lumbar region: Secondary | ICD-10-CM | POA: Diagnosis not present

## 2022-06-14 DIAGNOSIS — M5380 Other specified dorsopathies, site unspecified: Secondary | ICD-10-CM | POA: Diagnosis not present

## 2022-06-14 DIAGNOSIS — M47816 Spondylosis without myelopathy or radiculopathy, lumbar region: Secondary | ICD-10-CM | POA: Diagnosis not present

## 2022-06-15 DIAGNOSIS — M461 Sacroiliitis, not elsewhere classified: Secondary | ICD-10-CM | POA: Diagnosis not present

## 2022-06-27 DIAGNOSIS — M159 Polyosteoarthritis, unspecified: Secondary | ICD-10-CM | POA: Diagnosis not present

## 2022-06-27 DIAGNOSIS — R768 Other specified abnormal immunological findings in serum: Secondary | ICD-10-CM | POA: Diagnosis not present

## 2022-07-07 DIAGNOSIS — M5416 Radiculopathy, lumbar region: Secondary | ICD-10-CM | POA: Diagnosis not present

## 2022-07-07 DIAGNOSIS — M5136 Other intervertebral disc degeneration, lumbar region: Secondary | ICD-10-CM | POA: Diagnosis not present

## 2022-07-25 NOTE — Progress Notes (Unsigned)
Referring Physician:  Loman Brooklyn, MD No address on file  Primary Physician:  Loman Brooklyn, MD  History of Present Illness: 07/25/2022 Mr. Dillon Singh is here today with a chief complaint of *** low back pain that doesn't radiate into the legs  Duration: ***increasing since 2022 Location: *** Quality: ***severe, sharp, stiff, aching  Severity: *** 10/10 Precipitating: aggravated by *** Modifying factors: made better by ***standing, walking, and moving around Weakness: none Timing: ***constant  Bowel/Bladder Dysfunction: none  Conservative measures:  Physical therapy: *** has not participated in? Multimodal medical therapy including regular antiinflammatories: *** celebrex, gabapentin Injections: *** has received epidural steroid injections 06/15/2022: Right sacroiliac joint injection under fluoroscopy (20% relief) 05/31/2022: Right L3-4 and L4-5 transforaminal ESI (no relief) 05/16/2022: RIGHT S1 trigger point injection (no relief)  05/06/2022: MBB to the bilateral L4-5 and L5-S1 facet joints (9/10 to 5/10) 05/03/2016: Left L2-3 transforaminal ESI  Past Surgery: *** 05/11/2016: Left L2-3 far lateral microdiscectomy  Dillon Singh has ***no symptoms of cervical myelopathy.  The symptoms are causing a significant impact on the patient's life.   I have utilized the care everywhere function in epic to review the outside records available from external health systems.  Review of Systems:  A 10 point review of systems is negative, except for the pertinent positives and negatives detailed in the HPI.  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[facility])    Hypothyroidism    Pneumonia    Substance abuse ([facility])     Past Surgical History: Past  Surgical History:  Procedure Laterality Date   APPENDECTOMY     COLONOSCOPY     FRACTURE SURGERY Left    hand injury   LUMBAR LAMINECTOMY/DECOMPRESSION MICRODISCECTOMY Left 05/11/2016   Procedure: LUMBAR LAMINECTOMY/DECOMPRESSION MICRODISCECTOMY L2-3;  Surgeon: Venetia Night, MD;  Location: [facility] ORS;  Service: Neurosurgery;  Laterality: Left;   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   TOTAL HIP ARTHROPLASTY Left 11/03/2020   Procedure: TOTAL HIP ARTHROPLASTY ANTERIOR APPROACH;  Surgeon: Kennedy Bucker, MD;  Location: [facility] OR[facility];  Service: Orthopedics;  Laterality: Left;   TOTAL HIP ARTHROPLASTY Right 06/22/2021   Procedure: TOTAL HIP ARTHROPLASTY ANTERIOR APPROACH;  Surgeon: Kennedy Bucker, MD;  Location: [facility];  Service: Orthopedics;  Laterality: Right;    Allergies: Allergies as of 07/26/2022   (No Known Allergies)    Medications:  Current Outpatient Medications:    acetaminophen (TYLENOL) 500 MG tablet, Take 1,000 mg by mouth every 6 (six) hours as needed for moderate pain or mild pain., Disp: , Rfl:    albuterol (VENTOLIN HFA) 108 (90 Base) MCG/ACT inhaler, Inhale 1-2 puffs into the lungs every 6 (six) hours as needed for wheezing or shortness of breath., Disp: 1 each, Rfl: 0   azithromycin (ZITHROMAX) 250 MG tablet, Take 1 tablet (250 mg total) by mouth daily. Take first 2 tablets together, then 1 every day until finished., Disp: 6 tablet, Rfl: 0   celecoxib (CELEBREX) 100 MG capsule, Take 100 mg by mouth in the morning and at bedtime., Disp: , Rfl:    escitalopram (LEXAPRO) 20 MG tablet, Take 20 mg by mouth in the morning., Disp: , Rfl:    gabapentin (NEURONTIN) 100 MG  capsule, Take 200 mg by mouth 3 (three) times daily., Disp: , Rfl:    levothyroxine (SYNTHROID, LEVOTHROID) 100 MCG tablet, Take 100 mcg by mouth daily before breakfast., Disp: , Rfl:    predniSONE (DELTASONE) 10 MG tablet, Take 1 tablet (10 mg total) by mouth daily. 6,5,4,3,2,1 six day taper, Disp: 21 tablet,  Rfl: 0   promethazine-dextromethorphan (PROMETHAZINE-DM) 6.25-15 MG/5ML syrup, Take 5 mLs by mouth 4 (four) times daily as needed for cough., Disp: 60 mL, Rfl: 0   rosuvastatin (CRESTOR) 20 MG tablet, Take 20-40 mg by mouth See admin instructions. Take 2 tablets (40 mg) by mouth on Mondays & Thursdays in the evening. Take 1 tablet (20 mg) by mouth on Sundays, Tuesdays, Wednesdays, Fridays & Saturdays in the evening., Disp: , Rfl:    traMADol (ULTRAM) 50 MG tablet, Take 1 tablet (50 mg total) by mouth every 6 (six) hours as needed., Disp: 30 tablet, Rfl: 0   triamcinolone cream (KENALOG) 0.1 %, Apply 1 Application topically 2 (two) times daily., Disp: 30 g, Rfl: 0  Social History: Social History   Tobacco Use   Smoking status: Every Day    Packs/day: 1    Types: Cigarettes   Smokeless tobacco: Never  Vaping Use   Vaping Use: Never used  Substance Use Topics   Alcohol use: No   Drug use: Yes    Types: Marijuana    Comment: last smoked 06/10/21    Family Medical History: Family History  Problem Relation Age of Onset   Diabetes Mother     Physical Examination: There were no vitals filed for this visit.  General: Patient is in no apparent distress. Attention to examination is appropriate.  Neck:   Supple.  Full range of motion.  Respiratory: Patient is breathing without any difficulty.   NEUROLOGICAL:     Awake, alert, oriented to person, place, and time.  Speech is clear and fluent.   Cranial Nerves: Pupils equal round and reactive to light.  Facial tone is symmetric.  Facial sensation is symmetric. Shoulder shrug is symmetric. Tongue protrusion is midline.  There is no pronator drift.  Strength: Side Biceps Triceps Deltoid Interossei Grip Wrist Ext. Wrist Flex.  R 5 5 5 5 5 5 5   L 5 5 5 5 5 5 5    Side Iliopsoas Quads Hamstring PF DF EHL  R 5 5 5 5 5 5   L 5 5 5 5 5 5    Reflexes are ***2+ and symmetric at the biceps, triceps, brachioradialis, patella and achilles.    Hoffman's is absent.   Bilateral upper and lower extremity sensation is intact to light touch.    No evidence of dysmetria noted.  Gait is normal.     Medical Decision Making  Imaging: ***  I have personally reviewed the images and agree with the above interpretation.  Assessment and Plan: Mr. Vansomeren is a pleasant 62 y.o. male with ***    Thank you for involving me in the care of this patient.      Junita Kubota K. Myer Haff MD, [facility] Neurosurgery

## 2022-07-26 ENCOUNTER — Ambulatory Visit: Payer: Medicare HMO | Admitting: Neurosurgery

## 2022-07-26 ENCOUNTER — Encounter: Payer: Self-pay | Admitting: Neurosurgery

## 2022-07-26 VITALS — BP 122/72 | Ht 70.0 in | Wt 165.0 lb

## 2022-07-26 DIAGNOSIS — R1031 Right lower quadrant pain: Secondary | ICD-10-CM | POA: Diagnosis not present

## 2022-07-26 DIAGNOSIS — M545 Low back pain, unspecified: Secondary | ICD-10-CM | POA: Diagnosis not present

## 2022-07-26 DIAGNOSIS — G8929 Other chronic pain: Secondary | ICD-10-CM | POA: Diagnosis not present

## 2022-07-29 DIAGNOSIS — I700 Atherosclerosis of aorta: Secondary | ICD-10-CM | POA: Diagnosis not present

## 2022-07-29 DIAGNOSIS — M1611 Unilateral primary osteoarthritis, right hip: Secondary | ICD-10-CM | POA: Diagnosis not present

## 2022-07-29 DIAGNOSIS — T8484XA Pain due to internal orthopedic prosthetic devices, implants and grafts, initial encounter: Secondary | ICD-10-CM | POA: Diagnosis not present

## 2022-07-29 DIAGNOSIS — Z96641 Presence of right artificial hip joint: Secondary | ICD-10-CM | POA: Diagnosis not present

## 2022-07-29 DIAGNOSIS — Z96649 Presence of unspecified artificial hip joint: Secondary | ICD-10-CM | POA: Diagnosis not present

## 2022-08-01 ENCOUNTER — Other Ambulatory Visit: Payer: Self-pay | Admitting: Orthopedic Surgery

## 2022-08-01 ENCOUNTER — Encounter: Payer: Self-pay | Admitting: Orthopedic Surgery

## 2022-08-01 DIAGNOSIS — T8484XA Pain due to internal orthopedic prosthetic devices, implants and grafts, initial encounter: Secondary | ICD-10-CM

## 2022-08-01 DIAGNOSIS — Z96641 Presence of right artificial hip joint: Secondary | ICD-10-CM

## 2022-08-03 ENCOUNTER — Encounter
Admission: RE | Admit: 2022-08-03 | Discharge: 2022-08-03 | Disposition: A | Discharge status: Home / Self Care | Payer: Medicare HMO | Source: Ambulatory Visit | Attending: Orthopedic Surgery | Admitting: Orthopedic Surgery

## 2022-08-03 DIAGNOSIS — T8484XA Pain due to internal orthopedic prosthetic devices, implants and grafts, initial encounter: Secondary | ICD-10-CM | POA: Diagnosis not present

## 2022-08-03 DIAGNOSIS — M25551 Pain in right hip: Secondary | ICD-10-CM | POA: Diagnosis not present

## 2022-08-03 DIAGNOSIS — Z96641 Presence of right artificial hip joint: Secondary | ICD-10-CM | POA: Diagnosis not present

## 2022-08-03 DIAGNOSIS — Z96649 Presence of unspecified artificial hip joint: Secondary | ICD-10-CM | POA: Diagnosis not present

## 2022-08-03 MED ORDER — TECHNETIUM TC 99M MEDRONATE IV KIT
20.0000 | PACK | Freq: Once | INTRAVENOUS | Status: AC | PRN
  Administered 2022-08-03: 21.12 via INTRAVENOUS

## 2022-08-26 DIAGNOSIS — M25851 Other specified joint disorders, right hip: Secondary | ICD-10-CM | POA: Diagnosis not present

## 2022-08-26 DIAGNOSIS — M76891 Other specified enthesopathies of right lower limb, excluding foot: Secondary | ICD-10-CM | POA: Diagnosis not present

## 2022-08-30 DIAGNOSIS — M76891 Other specified enthesopathies of right lower limb, excluding foot: Secondary | ICD-10-CM | POA: Diagnosis not present

## 2022-09-28 DIAGNOSIS — M25551 Pain in right hip: Secondary | ICD-10-CM | POA: Diagnosis not present

## 2022-10-04 ENCOUNTER — Other Ambulatory Visit: Payer: Self-pay | Admitting: Orthopedic Surgery

## 2022-10-04 DIAGNOSIS — Z96641 Presence of right artificial hip joint: Secondary | ICD-10-CM

## 2022-10-06 ENCOUNTER — Ambulatory Visit
Admission: RE | Admit: 2022-10-06 | Discharge: 2022-10-06 | Disposition: A | Discharge status: Home / Self Care | Payer: Medicare HMO | Source: Ambulatory Visit | Attending: Orthopedic Surgery | Admitting: Orthopedic Surgery

## 2022-10-06 DIAGNOSIS — Z96643 Presence of artificial hip joint, bilateral: Secondary | ICD-10-CM | POA: Diagnosis not present

## 2022-10-06 DIAGNOSIS — Z96641 Presence of right artificial hip joint: Secondary | ICD-10-CM | POA: Diagnosis not present

## 2022-10-06 DIAGNOSIS — M545 Low back pain, unspecified: Secondary | ICD-10-CM | POA: Diagnosis not present

## 2022-10-07 DIAGNOSIS — M7071 Other bursitis of hip, right hip: Secondary | ICD-10-CM | POA: Diagnosis not present

## 2022-10-07 DIAGNOSIS — Z79899 Other long term (current) drug therapy: Secondary | ICD-10-CM | POA: Diagnosis not present

## 2022-10-07 DIAGNOSIS — M5416 Radiculopathy, lumbar region: Secondary | ICD-10-CM | POA: Diagnosis not present

## 2022-10-07 DIAGNOSIS — M5136 Other intervertebral disc degeneration, lumbar region: Secondary | ICD-10-CM | POA: Diagnosis not present

## 2022-10-17 DIAGNOSIS — F320 Major depressive disorder, single episode, mild: Secondary | ICD-10-CM | POA: Diagnosis not present

## 2022-10-17 DIAGNOSIS — E236 Other disorders of pituitary gland: Secondary | ICD-10-CM | POA: Diagnosis not present

## 2022-10-17 DIAGNOSIS — I700 Atherosclerosis of aorta: Secondary | ICD-10-CM | POA: Diagnosis not present

## 2022-10-17 DIAGNOSIS — M545 Low back pain, unspecified: Secondary | ICD-10-CM | POA: Diagnosis not present

## 2022-10-17 DIAGNOSIS — N182 Chronic kidney disease, stage 2 (mild): Secondary | ICD-10-CM | POA: Diagnosis not present

## 2022-10-17 DIAGNOSIS — E039 Hypothyroidism, unspecified: Secondary | ICD-10-CM | POA: Diagnosis not present

## 2022-10-17 DIAGNOSIS — E782 Mixed hyperlipidemia: Secondary | ICD-10-CM | POA: Diagnosis not present

## 2022-10-20 DIAGNOSIS — E236 Other disorders of pituitary gland: Secondary | ICD-10-CM | POA: Diagnosis not present

## 2022-10-20 DIAGNOSIS — E782 Mixed hyperlipidemia: Secondary | ICD-10-CM | POA: Diagnosis not present

## 2022-10-20 DIAGNOSIS — F320 Major depressive disorder, single episode, mild: Secondary | ICD-10-CM | POA: Diagnosis not present

## 2022-10-20 DIAGNOSIS — Z23 Encounter for immunization: Secondary | ICD-10-CM | POA: Diagnosis not present

## 2022-10-20 DIAGNOSIS — E039 Hypothyroidism, unspecified: Secondary | ICD-10-CM | POA: Diagnosis not present

## 2022-10-20 DIAGNOSIS — I700 Atherosclerosis of aorta: Secondary | ICD-10-CM | POA: Diagnosis not present

## 2022-10-20 DIAGNOSIS — N182 Chronic kidney disease, stage 2 (mild): Secondary | ICD-10-CM | POA: Diagnosis not present

## 2022-10-20 DIAGNOSIS — Z0000 Encounter for general adult medical examination without abnormal findings: Secondary | ICD-10-CM | POA: Diagnosis not present

## 2022-10-20 DIAGNOSIS — R7303 Prediabetes: Secondary | ICD-10-CM | POA: Diagnosis not present

## 2022-11-07 DIAGNOSIS — Z96642 Presence of left artificial hip joint: Secondary | ICD-10-CM | POA: Diagnosis not present

## 2022-11-07 DIAGNOSIS — M25551 Pain in right hip: Secondary | ICD-10-CM | POA: Diagnosis not present

## 2022-11-07 DIAGNOSIS — Z96643 Presence of artificial hip joint, bilateral: Secondary | ICD-10-CM | POA: Diagnosis not present

## 2022-11-07 DIAGNOSIS — T8484XA Pain due to internal orthopedic prosthetic devices, implants and grafts, initial encounter: Secondary | ICD-10-CM | POA: Diagnosis not present

## 2022-11-07 DIAGNOSIS — M7611 Psoas tendinitis, right hip: Secondary | ICD-10-CM | POA: Diagnosis not present

## 2022-11-07 DIAGNOSIS — Z96641 Presence of right artificial hip joint: Secondary | ICD-10-CM | POA: Diagnosis not present

## 2022-11-07 DIAGNOSIS — M24551 Contracture, right hip: Secondary | ICD-10-CM | POA: Diagnosis not present

## 2022-11-07 DIAGNOSIS — Z96649 Presence of unspecified artificial hip joint: Secondary | ICD-10-CM | POA: Diagnosis not present

## 2022-11-25 ENCOUNTER — Ambulatory Visit
Admission: EM | Admit: 2022-11-25 | Discharge: 2022-11-25 | Disposition: A | Discharge status: Home / Self Care | Payer: Medicare HMO | Attending: Emergency Medicine | Admitting: Emergency Medicine

## 2022-11-25 DIAGNOSIS — L259 Unspecified contact dermatitis, unspecified cause: Secondary | ICD-10-CM

## 2022-11-25 DIAGNOSIS — M76891 Other specified enthesopathies of right lower limb, excluding foot: Secondary | ICD-10-CM | POA: Diagnosis not present

## 2022-11-25 MED ORDER — PREDNISONE 10 MG (21) PO TBPK: ORAL_TABLET | Freq: Every day | ORAL | 0 refills | Status: AC

## 2022-11-25 NOTE — ED Provider Notes (Signed)
Renaldo Fiddler    CSN: [id number] Arrival date & time: 11/25/22  1113      History   Chief Complaint Chief Complaint  Patient presents with   Rash    HPI CALEL PISARSKI is a 62 y.o. male.  Patient presents with 2-day history of pruritic rash on his right forearm.  The rash has spread across his forearm and now to his upper arm.  No open wounds or drainage.  No fever, chills, sore throat, other rash, or other symptoms.  No OTC medications taken today.  He took 1 dose of Claritin yesterday and 1 dose of Benadryl the day before.  The history is provided by the patient and medical records.    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   Status post total hip replacement, left 11/03/2020   Lumbar back pain 08/01/2013   Viral syndrome 03/21/2013   Anxiety 11/17/2011   General medical examination 03/30/2011   Skin lesion 09/13/2010   HYPERLIPIDEMIA 08/12/2009   ERECTILE DYSFUNCTION 08/15/2008   PAIN IN JOINT, MULTIPLE SITES 08/15/2008   DEPRESSIVE DISORDER 07/17/2008   FIBROMYALGIA 07/17/2008   RASH-NONVESICULAR 07/17/2008   INSOMNIA-SLEEP DISORDER-UNSPEC 06/17/2008   TOBACCO USE 04/03/2008   NECK MASS 03/11/2008   ACUTE LYMPHADENITIS 02/26/2008   Hypothyroidism 02/04/2008   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   Backache 02/04/2008   DIZZINESS 02/04/2008    Past Surgical History:  Procedure Laterality Date   APPENDECTOMY     COLONOSCOPY     FRACTURE SURGERY Left    hand injury   LUMBAR LAMINECTOMY/DECOMPRESSION MICRODISCECTOMY Left 05/11/2016   Procedure: LUMBAR  LAMINECTOMY/DECOMPRESSION MICRODISCECTOMY L2-3;  Surgeon: Venetia Night, MD;  Location: [facility] OR[facility];  Service: Neurosurgery;  Laterality: Left;   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 Authorizing Provider  predniSONE (STERAPRED UNI-PAK 21 TAB) 10 MG (21) TBPK tablet Take by mouth daily. As directed 11/25/22  Yes Mickie Bail, NP  acetaminophen (TYLENOL) 500 MG tablet Take 1,000 mg by mouth every 6 (six) hours as needed for moderate pain or mild pain.    [provider]  escitalopram (LEXAPRO) 20 MG tablet Take 20 mg by mouth in the morning. 10/22/18   [provider]  gabapentin (NEURONTIN) 100 MG capsule Take 200 mg by mouth 3 (three) times daily. 11/15/21   [provider]  HYDROcodone-acetaminophen (NORCO/VICODIN) 5-325 MG tablet Take 1 tablet by mouth every 6 (six) hours as needed. Patient not taking: Reported on 11/25/2022 05/23/22  [provider]  levothyroxine (SYNTHROID, LEVOTHROID) 100 MCG tablet Take 100 mcg by mouth daily before breakfast.    [provider]  rosuvastatin (CRESTOR) 20 MG tablet Take 20-40 mg by mouth See admin instructions. Take 2 tablets (40 mg) by mouth on Mondays & Thursdays in the evening. Take 1 tablet (20 mg) by mouth on Sundays, Tuesdays, Wednesdays, Fridays & Saturdays in the evening. 08/03/20   [provider]    Family History Family History  Problem Relation Age of Onset   Diabetes Mother     Social History Social History    Tobacco Use   Smoking status: Every Day    Current packs/day: 1.00    Types: Cigarettes   Smokeless tobacco: Never  Vaping Use   Vaping status: Never Used  Substance Use Topics   Alcohol use: No   Drug use: Yes    Types: Marijuana    Comment: last smoked 06/10/21     Allergies   Patient has no known allergies.   Review of Systems Review of Systems  Constitutional:  Negative for chills and fever.  Musculoskeletal:  Negative for arthralgias and joint swelling.  Skin:  Positive for color change and rash.     Physical Exam Triage Vital Signs ED Triage Vitals  Encounter Vitals Group     BP 11/25/22 1200 134/86     Systolic BP Percentile --      Diastolic BP Percentile --      Pulse Rate 11/25/22 1151 78     Resp 11/25/22 1151 18     Temp 11/25/22 1151 97.7 F (36.5 C)     Temp src --      SpO2 11/25/22 1151 98 %     Weight --      Height --      Head Circumference --      Peak Flow --      Pain Score 11/25/22 1149 0     Pain Loc --      Pain Education --      Exclude from Growth Chart --    No data found.  Updated Vital Signs BP 134/86   Pulse 78   Temp 97.7 F (36.5 C)   Resp 18   SpO2 98%   Visual Acuity Right Eye Distance:   Left Eye Distance:   Bilateral Distance:    Right Eye Near:   Left Eye Near:    Bilateral Near:     Physical Exam Constitutional:      General: He is not in acute distress. HENT:     Mouth/Throat:     Mouth: Mucous membranes are moist.  Cardiovascular:     Rate and Rhythm: Normal rate and regular rhythm.  Pulmonary:     Effort: Pulmonary effort is normal. No respiratory distress.  Skin:    General: Skin is warm and dry.     Findings: Rash present.     Comments: Patchy erythematous rash on right forearm and upper arm.  No open wounds or drainage.  Neurological:     Mental Status: He is alert.      UC Treatments / Results  Labs (all labs ordered are listed, but only abnormal results are displayed) Labs  Reviewed - No data to display  EKG   Radiology No results found.  Procedures Procedures (including critical care time)  Medications Ordered in UC Medications - No data to display  Initial Impression / Assessment and Plan / UC Course  I have reviewed the triage vital signs and the nursing notes.  Pertinent labs & imaging results that were available during my care of the patient were reviewed by me and considered in my medical decision making (see chart for details).    Contact dermatitis.  Afebrile and vital signs are stable.  Treating with prednisone and Zyrtec.  Education provided on contact dermatitis.  Instructed patient to follow-up with his PCP if he is not improving.  He agrees to plan of care.  Final Clinical Impressions(s) / UC Diagnoses   Final diagnoses:  Contact dermatitis, unspecified contact dermatitis type, unspecified trigger     Discharge Instructions      Take prednisone and Zyrtec as directed.  Follow-up with your primary care provider if your symptoms are not improving.      ED Prescriptions     Medication Sig Dispense Auth. Provider   predniSONE (STERAPRED UNI-PAK 21 TAB) 10 MG (21) TBPK tablet Take by mouth daily. As directed 21 tablet Mickie Bail, NP      PDMP not reviewed this encounter.   Mickie Bail, NP 11/25/22 1224

## 2022-11-25 NOTE — Discharge Instructions (Addendum)
Take prednisone and Zyrtec as directed.    Follow up with your primary care provider if your symptoms are not improving.

## 2022-11-25 NOTE — ED Triage Notes (Signed)
Patient to Urgent Care with complaints of an itchy rash present to his right forearm.  Reports symptoms started two days ago. Denies any new product usage. Reports he spends a lot of time outside.  Dose of Claritin yesterday/ benadryl the day before.

## 2022-12-20 DIAGNOSIS — M76891 Other specified enthesopathies of right lower limb, excluding foot: Secondary | ICD-10-CM | POA: Diagnosis not present

## 2022-12-28 DIAGNOSIS — M150 Primary generalized (osteo)arthritis: Secondary | ICD-10-CM | POA: Diagnosis not present

## 2022-12-28 DIAGNOSIS — R768 Other specified abnormal immunological findings in serum: Secondary | ICD-10-CM | POA: Diagnosis not present

## 2023-01-16 DIAGNOSIS — R42 Dizziness and giddiness: Secondary | ICD-10-CM | POA: Diagnosis not present

## 2023-01-16 DIAGNOSIS — G4452 New daily persistent headache (NDPH): Secondary | ICD-10-CM | POA: Diagnosis not present

## 2023-01-23 DIAGNOSIS — E039 Hypothyroidism, unspecified: Secondary | ICD-10-CM | POA: Diagnosis not present

## 2023-01-23 DIAGNOSIS — Z79890 Hormone replacement therapy (postmenopausal): Secondary | ICD-10-CM | POA: Diagnosis not present

## 2023-01-23 DIAGNOSIS — F17210 Nicotine dependence, cigarettes, uncomplicated: Secondary | ICD-10-CM | POA: Diagnosis not present

## 2023-01-23 DIAGNOSIS — Z79899 Other long term (current) drug therapy: Secondary | ICD-10-CM | POA: Diagnosis not present

## 2023-01-23 DIAGNOSIS — Z791 Long term (current) use of non-steroidal anti-inflammatories (NSAID): Secondary | ICD-10-CM | POA: Diagnosis not present

## 2023-01-23 DIAGNOSIS — M76891 Other specified enthesopathies of right lower limb, excluding foot: Secondary | ICD-10-CM | POA: Diagnosis not present

## 2023-01-24 DIAGNOSIS — M76891 Other specified enthesopathies of right lower limb, excluding foot: Secondary | ICD-10-CM | POA: Diagnosis not present

## 2023-01-24 DIAGNOSIS — M25551 Pain in right hip: Secondary | ICD-10-CM | POA: Diagnosis not present

## 2023-01-24 DIAGNOSIS — E785 Hyperlipidemia, unspecified: Secondary | ICD-10-CM | POA: Diagnosis not present

## 2023-01-24 DIAGNOSIS — M797 Fibromyalgia: Secondary | ICD-10-CM | POA: Diagnosis not present

## 2023-01-24 DIAGNOSIS — I890 Lymphedema, not elsewhere classified: Secondary | ICD-10-CM | POA: Diagnosis not present

## 2023-01-24 DIAGNOSIS — Z4789 Encounter for other orthopedic aftercare: Secondary | ICD-10-CM | POA: Diagnosis not present

## 2023-01-24 DIAGNOSIS — Z87891 Personal history of nicotine dependence: Secondary | ICD-10-CM | POA: Diagnosis not present

## 2023-01-24 DIAGNOSIS — E039 Hypothyroidism, unspecified: Secondary | ICD-10-CM | POA: Diagnosis not present

## 2023-02-20 DIAGNOSIS — M5416 Radiculopathy, lumbar region: Secondary | ICD-10-CM | POA: Diagnosis not present

## 2023-02-20 DIAGNOSIS — M48062 Spinal stenosis, lumbar region with neurogenic claudication: Secondary | ICD-10-CM | POA: Diagnosis not present

## 2023-03-01 DIAGNOSIS — G4452 New daily persistent headache (NDPH): Secondary | ICD-10-CM | POA: Diagnosis not present

## 2023-03-01 DIAGNOSIS — Z049 Encounter for examination and observation for unspecified reason: Secondary | ICD-10-CM | POA: Diagnosis not present

## 2023-03-02 ENCOUNTER — Other Ambulatory Visit: Payer: Self-pay | Admitting: Specialist

## 2023-03-02 DIAGNOSIS — R519 Headache, unspecified: Secondary | ICD-10-CM

## 2023-03-06 ENCOUNTER — Ambulatory Visit
Admission: RE | Admit: 2023-03-06 | Discharge: 2023-03-06 | Disposition: A | Discharge status: Home / Self Care | Payer: Medicare HMO | Source: Ambulatory Visit | Attending: Specialist | Admitting: Specialist

## 2023-03-06 DIAGNOSIS — I6782 Cerebral ischemia: Secondary | ICD-10-CM | POA: Diagnosis not present

## 2023-03-06 DIAGNOSIS — R519 Headache, unspecified: Secondary | ICD-10-CM | POA: Insufficient documentation

## 2023-03-20 DIAGNOSIS — Z79899 Other long term (current) drug therapy: Secondary | ICD-10-CM | POA: Diagnosis not present

## 2023-03-20 DIAGNOSIS — M542 Cervicalgia: Secondary | ICD-10-CM | POA: Diagnosis not present

## 2023-03-20 DIAGNOSIS — G518 Other disorders of facial nerve: Secondary | ICD-10-CM | POA: Diagnosis not present

## 2023-03-20 DIAGNOSIS — G4452 New daily persistent headache (NDPH): Secondary | ICD-10-CM | POA: Diagnosis not present

## 2023-03-20 DIAGNOSIS — M791 Myalgia, unspecified site: Secondary | ICD-10-CM | POA: Diagnosis not present

## 2023-03-29 ENCOUNTER — Emergency Department
Admission: EM | Admit: 2023-03-29 | Discharge: 2023-03-29 | Disposition: A | Discharge status: Home / Self Care | Payer: Medicare HMO | Attending: Emergency Medicine | Admitting: Emergency Medicine

## 2023-03-29 ENCOUNTER — Other Ambulatory Visit: Payer: Self-pay

## 2023-03-29 DIAGNOSIS — G43819 Other migraine, intractable, without status migrainosus: Secondary | ICD-10-CM

## 2023-03-29 DIAGNOSIS — R519 Headache, unspecified: Secondary | ICD-10-CM | POA: Diagnosis present

## 2023-03-29 DIAGNOSIS — G43809 Other migraine, not intractable, without status migrainosus: Secondary | ICD-10-CM | POA: Insufficient documentation

## 2023-03-29 LAB — CBC
HCT: 44.9 % (ref 39.0–52.0)
Hemoglobin: 15.3 g/dL (ref 13.0–17.0)
MCH: 28.9 pg (ref 26.0–34.0)
MCHC: 34.1 g/dL (ref 30.0–36.0)
MCV: 84.7 fL (ref 80.0–100.0)
Platelets: 247 10*3/uL (ref 150–400)
RBC: 5.3 MIL/uL (ref 4.22–5.81)
RDW: 13.3 % (ref 11.5–15.5)
WBC: 13.1 10*3/uL — ABNORMAL HIGH (ref 4.0–10.5)
nRBC: 0 % (ref 0.0–0.2)

## 2023-03-29 LAB — COMPREHENSIVE METABOLIC PANEL
ALT: 14 U/L (ref 0–44)
AST: 15 U/L (ref 15–41)
Albumin: 3.8 g/dL (ref 3.5–5.0)
Alkaline Phosphatase: 51 U/L (ref 38–126)
Anion gap: 9 (ref 5–15)
BUN: 15 mg/dL (ref 8–23)
CO2: 25 mmol/L (ref 22–32)
Calcium: 8.7 mg/dL — ABNORMAL LOW (ref 8.9–10.3)
Chloride: 100 mmol/L (ref 98–111)
Creatinine, Ser: 1.19 mg/dL (ref 0.61–1.24)
GFR, Estimated: >60 mL/min (ref >60)
Glucose, Bld: 111 mg/dL — ABNORMAL HIGH (ref 70–99)
Potassium: 4.9 mmol/L (ref 3.5–5.1)
Sodium: 134 mmol/L — ABNORMAL LOW (ref 135–145)
Total Bilirubin: 0.5 mg/dL (ref 0.0–1.2)
Total Protein: 6.8 g/dL (ref 6.5–8.1)

## 2023-03-29 MED ORDER — KETOROLAC TROMETHAMINE 15 MG/ML IJ SOLN
15.0000 mg | Freq: Once | INTRAMUSCULAR | Status: AC
  Administered 2023-03-29: 15 mg via INTRAVENOUS
  Filled 2023-03-29: qty 1

## 2023-03-29 MED ORDER — DEXAMETHASONE SODIUM PHOSPHATE 10 MG/ML IJ SOLN
10.0000 mg | Freq: Once | INTRAMUSCULAR | Status: AC
  Administered 2023-03-29: 10 mg via INTRAVENOUS
  Filled 2023-03-29: qty 1

## 2023-03-29 MED ORDER — METOCLOPRAMIDE HCL 5 MG/ML IJ SOLN
10.0000 mg | Freq: Once | INTRAMUSCULAR | Status: AC
  Administered 2023-03-29: 10 mg via INTRAVENOUS
  Filled 2023-03-29: qty 2

## 2023-03-29 MED ORDER — ACETAMINOPHEN 500 MG PO TABS
1000.0000 mg | ORAL_TABLET | Freq: Once | ORAL | Status: AC
  Administered 2023-03-29: 1000 mg via ORAL
  Filled 2023-03-29: qty 2

## 2023-03-29 MED ORDER — DROPERIDOL 2.5 MG/ML IJ SOLN
1.2500 mg | Freq: Once | INTRAMUSCULAR | Status: AC
  Administered 2023-03-29: 1.25 mg via INTRAVENOUS
  Filled 2023-03-29: qty 2

## 2023-03-29 MED ORDER — DIPHENHYDRAMINE HCL 50 MG/ML IJ SOLN
12.5000 mg | Freq: Once | INTRAMUSCULAR | Status: AC
  Administered 2023-03-29: 12.5 mg via INTRAVENOUS
  Filled 2023-03-29: qty 1

## 2023-03-29 NOTE — ED Provider Triage Note (Signed)
 Emergency Medicine Provider Triage Evaluation Note  Dillon Singh , a 63 y.o. male  was evaluated in triage.  Pt complains of migraine since October, describes worsening symptoms in the last month. Patient has tried multiple medications and is unable to tolerate the pain.   Review of Systems  Positive: Headache, light sensitivity, nausea Negative: fevers  Physical Exam  BP 122/86 (BP Location: Left Arm)   Pulse 83   Temp 97.8 F (36.6 C) (Oral)   Resp 16   Ht 5' 10 (1.778 m)   Wt 74.8 kg   SpO2 98%   BMI 23.66 kg/m  Gen:   Awake, no distress   Resp:  Normal effort  MSK:   Moves extremities without difficulty  Other:    Medical Decision Making  Medically screening exam initiated at 9:17 AM.  Appropriate orders placed.  Elspeth LITTIE Basket was informed that the remainder of the evaluation will be completed by another provider, this initial triage assessment does not replace that evaluation, and the importance of remaining in the ED until their evaluation is complete.    Cleaster Tinnie LABOR, PA-C 03/29/23 [phone]

## 2023-03-29 NOTE — Discharge Instructions (Signed)
 Please call your migraine doctor and follow-up with them and return to the ER for changes in symptoms or any other concerns

## 2023-03-29 NOTE — ED Provider Notes (Signed)
 [facility] Provider Note    Event Date/Time   First MD Initiated Contact with Patient 03/29/23 1119     (approximate)   History   Headache   HPI  Dillon Singh is a 63 y.o. male with history of migraines since October with recent MRI that was reassuring who comes in with continued headache.  Patient reports that his headaches are very similar to October.  He states that there has not been a big change in the character of headache.  He reports following at a headache specialty clinic and been on multiple different medications without any significant relief.  He also reports having trigger injections without any relief.  He states that he came here to just try to help get the pain level back down.  Denies any changes in vision today, acute change of the headache today or other acute pathology.  Denies any chest pain, shortness of breath  Patient does report a lot of stress with the recent passing of both of his parents as well his wife being sick as well.   MPRESSION: 1. No evidence of an acute intracranial abnormality. 2. Mild chronic small vessel ischemic changes within the cerebral white matter, similar to the prior brain MRI of 11/29/2018. 3. 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 4. 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 5. Minor paranasal sinus mucosal thickening.     Triage Vital Signs: ED Triage Vitals  Encounter Vitals Group     BP 03/29/23 0910 122/86     Systolic BP Percentile --      Diastolic BP Percentile --      Pulse Rate 03/29/23 0910 83     Resp 03/29/23 0917 16     Temp 03/29/23 0910 97.8 F (36.6 C)     Temp Source 03/29/23 0910 Oral     SpO2 03/29/23 0910 98 %     Weight 03/29/23 0917 164 lb 14.5 oz (74.8 kg)     Height 03/29/23 0917 5' 10 (1.778 m)     Head Circumference --      Peak Flow --      Pain Score 03/29/23 0917 7     Pain Loc --      Pain Education --      Exclude from Growth Chart --      Most recent vital signs: Vitals:   03/29/23 0910 03/29/23 0917  BP: 122/86   Pulse: 83   Resp:  16  Temp: 97.8 F (36.6 C)   SpO2: 98%      General: Awake, no distress.  CV:  Good peripheral perfusion.  Resp:  Normal effort.  Abd:  No distention.  Other:  Cranials 2 through 12 are intact.  Equal strength in arms and legs. No pain on his temporal arteries  ED Results / Procedures / Treatments   Labs (all labs ordered are listed, but only abnormal results are displayed) Labs Reviewed  CBC - Abnormal; Notable for the following components:      Result Value   WBC 13.1 (*)    All other components within normal limits  COMPREHENSIVE METABOLIC PANEL - Abnormal; Notable for the following components:   Sodium 134 (*)    Glucose, Bld 111 (*)    Calcium  8.7 (*)    All other components within normal limits  TROPONIN I (HIGH SENSITIVITY)      RADIOLOGY I have reviewed the MRI brain from 03/10/2023 without evidence of of  acute infarct   PROCEDURES:  Critical Care performed: No  Procedures   MEDICATIONS ORDERED IN ED: Medications  metoCLOPramide  (REGLAN ) injection 10 mg (has no administration in time range)  diphenhydrAMINE  (BENADRYL ) injection 12.5 mg (has no administration in time range)  ketorolac  (TORADOL ) 15 MG/ML injection 15 mg (has no administration in time range)  acetaminophen  (TYLENOL ) tablet 1,000 mg (has no administration in time range)     IMPRESSION / MDM / ASSESSMENT AND PLAN / ED COURSE  I reviewed the triage vital signs and the nursing notes.   Patient's presentation is most consistent with acute presentation with potential threat to life or bodily function.   Patient comes in with headaches.  Patient had an MRI done on 1/17 that was reassuring.  He is also had prior MRI back in 2020 with and without contrast that was reassuring.  This headache is similar in character over the past few months 6 October does not seem consistent with subarachnoid  hemorrhage.  Discussed repeat imaging today but I do not feel like it is warranted given he reports that this is the same headache since October.  He has no new neurodeficit.  We discussed his mental health as well he does report significant stress we discussed how sometimes this can also affect headaches.  He expressed understanding and will follow-up this with his primary care doctor will try symptomatic treatment.  I can also see notes back to 2020 where patient was having headaches.  Do not feel like this is consistent with temporal arteritis  EKG my interpretation is sinus rate of 65 without any ST elevation or T wave inversion, QTc of 424  CBC shows slightly elevated white count but patient is afebrile, genetic and this has been ongoing for some time now.  CMP shows normal creatinine   Repeat  eval pt feelingbetter-requesting discharge home.  Patient be given a dose of Decadron  to try to help keep the headache down and he is going to follow-up with his migraine doctor on Monday.    FINAL CLINICAL IMPRESSION(S) / ED DIAGNOSES   Final diagnoses:  Other migraine without status migrainosus, intractable     Rx / DC Orders   ED Discharge Orders     None        Note:  This document was prepared using Dragon voice recognition software and may include unintentional dictation errors.   Ernest Ronal BRAVO, MD 03/29/23 [phone]

## 2023-03-29 NOTE — ED Triage Notes (Signed)
 Pt here with a headache since October. Pt endorse a little light sensitivity. Pt states he is nauseous in the mornings but denies diarrhea or vomiting. Pt denies fevers. Pt states he is being treated at the headache center in [place]. Pt state he has been taking metoclopramide , chlorpromazine, zonisamide and prednisone  with no relief. Pt stable in triage.

## 2023-04-04 DIAGNOSIS — G518 Other disorders of facial nerve: Secondary | ICD-10-CM | POA: Diagnosis not present

## 2023-04-04 DIAGNOSIS — M542 Cervicalgia: Secondary | ICD-10-CM | POA: Diagnosis not present

## 2023-04-04 DIAGNOSIS — M791 Myalgia, unspecified site: Secondary | ICD-10-CM | POA: Diagnosis not present

## 2023-04-04 DIAGNOSIS — G4452 New daily persistent headache (NDPH): Secondary | ICD-10-CM | POA: Diagnosis not present

## 2023-04-25 DIAGNOSIS — R7301 Impaired fasting glucose: Secondary | ICD-10-CM | POA: Diagnosis not present

## 2023-04-25 DIAGNOSIS — E236 Other disorders of pituitary gland: Secondary | ICD-10-CM | POA: Diagnosis not present

## 2023-04-25 DIAGNOSIS — E782 Mixed hyperlipidemia: Secondary | ICD-10-CM | POA: Diagnosis not present

## 2023-04-25 DIAGNOSIS — N182 Chronic kidney disease, stage 2 (mild): Secondary | ICD-10-CM | POA: Diagnosis not present

## 2023-04-25 DIAGNOSIS — I700 Atherosclerosis of aorta: Secondary | ICD-10-CM | POA: Diagnosis not present

## 2023-04-25 DIAGNOSIS — E039 Hypothyroidism, unspecified: Secondary | ICD-10-CM | POA: Diagnosis not present

## 2023-04-26 DIAGNOSIS — M542 Cervicalgia: Secondary | ICD-10-CM | POA: Diagnosis not present

## 2023-04-26 DIAGNOSIS — G4452 New daily persistent headache (NDPH): Secondary | ICD-10-CM | POA: Diagnosis not present

## 2023-04-26 DIAGNOSIS — M791 Myalgia, unspecified site: Secondary | ICD-10-CM | POA: Diagnosis not present

## 2023-04-26 DIAGNOSIS — G518 Other disorders of facial nerve: Secondary | ICD-10-CM | POA: Diagnosis not present

## 2023-04-27 IMAGING — DX DG HIP (WITH OR WITHOUT PELVIS) 2-3V*L*
2 series · 2 of 2 positions shown · non-contrast
Comparison: Left hip MRI 07/29/2020

CLINICAL DATA: Left hip replacement

EXAM:
DG HIP (WITH OR WITHOUT PELVIS) 2-3V LEFT

[pelvis ap]
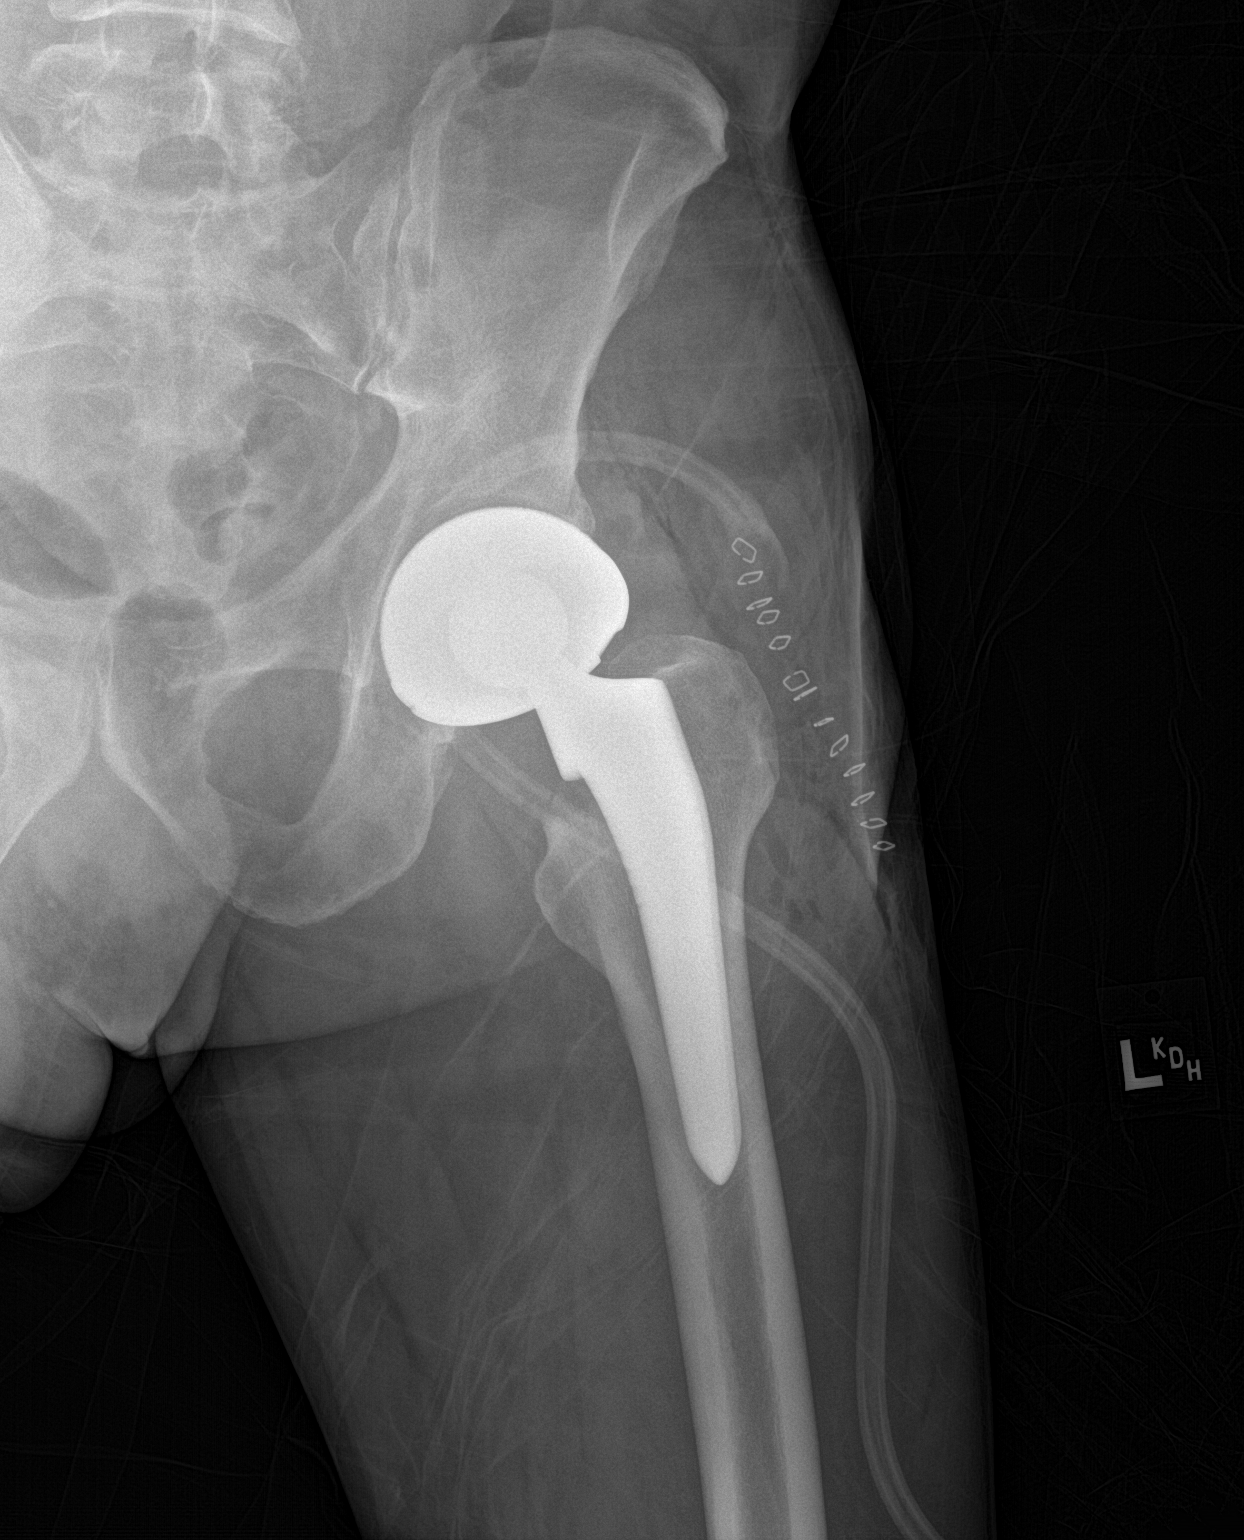

[hip lat]
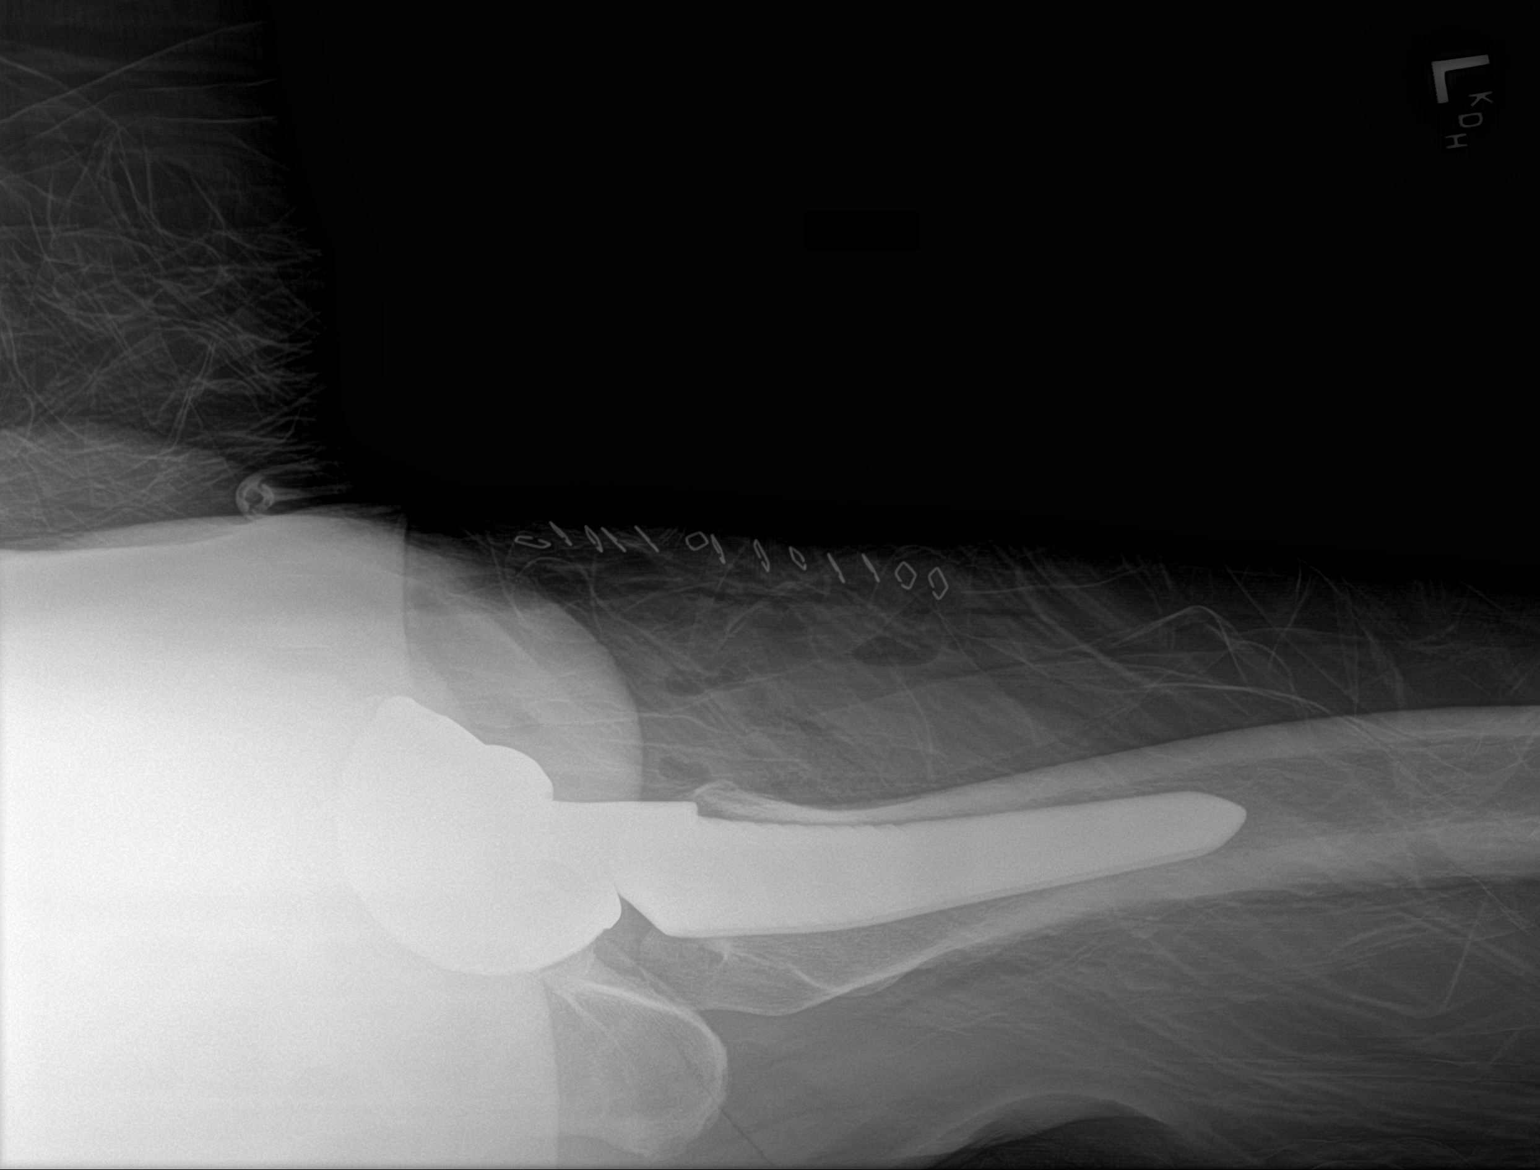

[2 of 2 positions shown; findings below may reference images not displayed]

FINDINGS: There is a left hip arthroplasty normal alignment without evidence
of loosening or fracture. Expected soft tissue changes.
IMPRESSION: Left hip arthroplasty without evidence of immediate hardware
complication.

## 2023-04-27 IMAGING — XA DG HIP (WITH PELVIS) OPERATIVE*L*
4 series · 15 of 16 positions shown · non-contrast
Comparison: None.

CLINICAL DATA: Left hip arthroplasty

EXAM:
OPERATIVE left HIP (WITH PELVIS IF PERFORMED) 4 VIEWS
TECHNIQUE: Fluoroscopic spot image(s) were submitted for interpretation
post-operatively.

[Series 1: ortho standard · 4 of 9 frames shown (1 of 4)]
[frame 2/9]
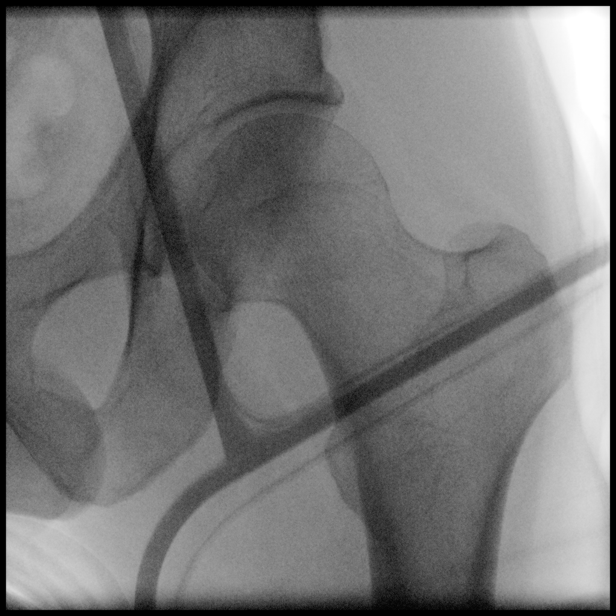
[frame 5/9]
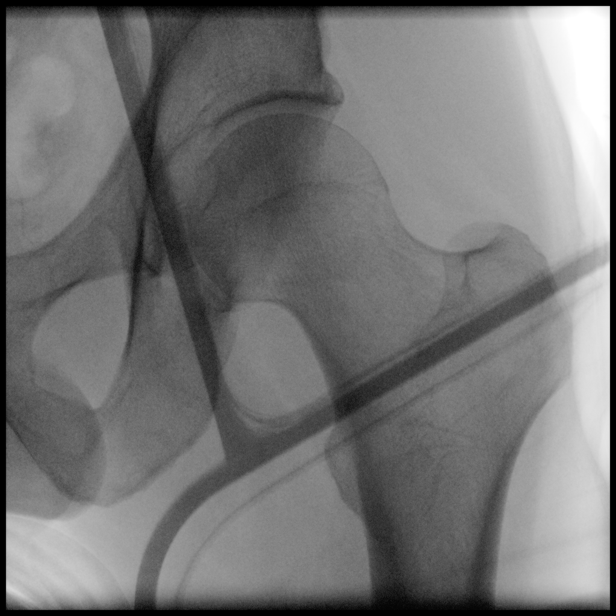
[frame 8/9]
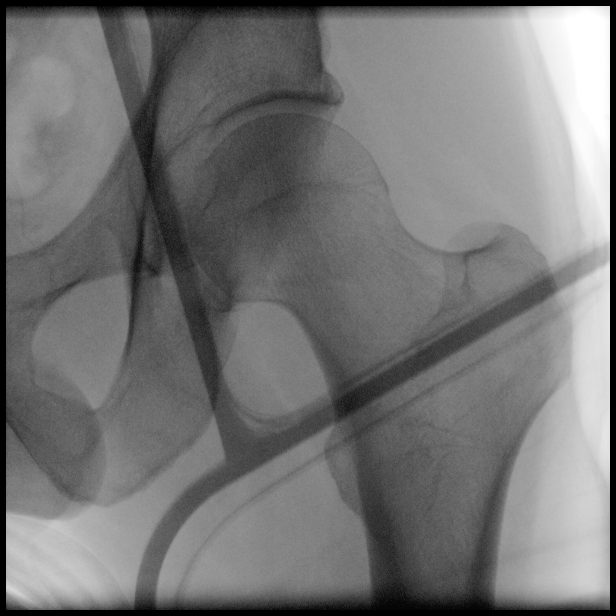
[frame 9/9]
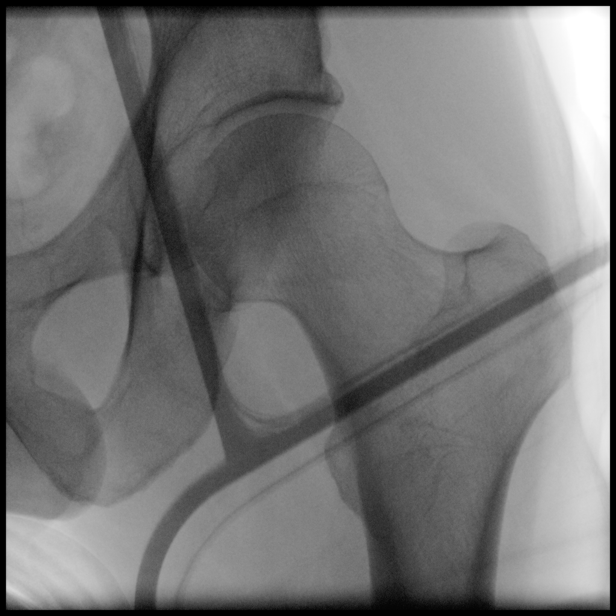

[Series 2: ortho standard · 3 of 9 frames shown (2 of 4)]
[frame 2/9]
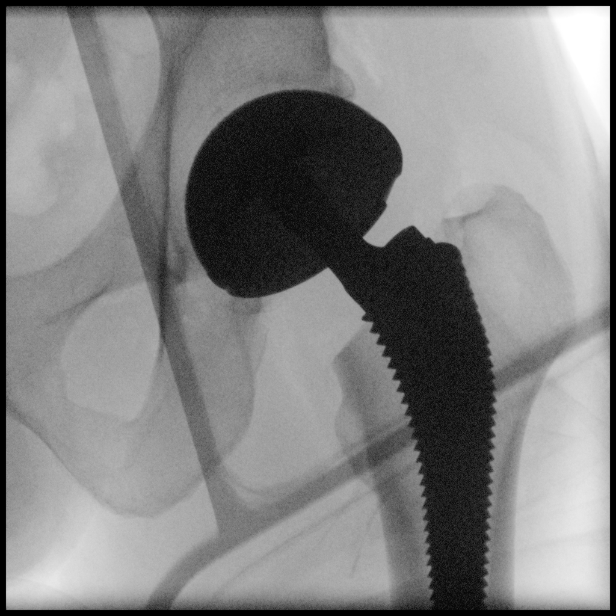
[frame 5/9]
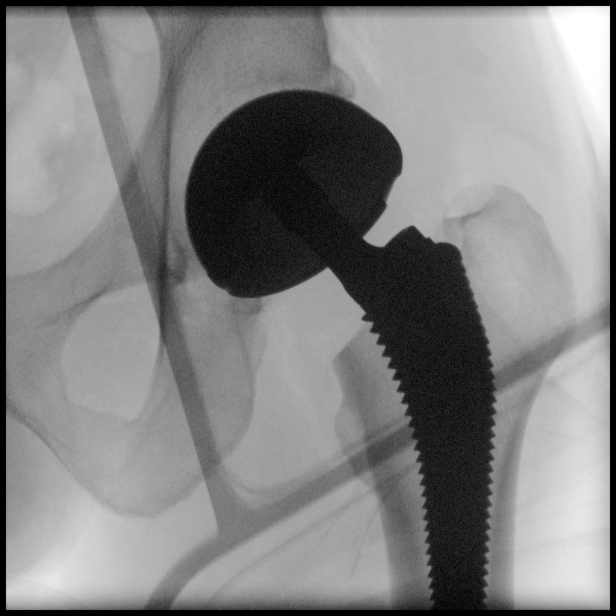
[frame 7/9]
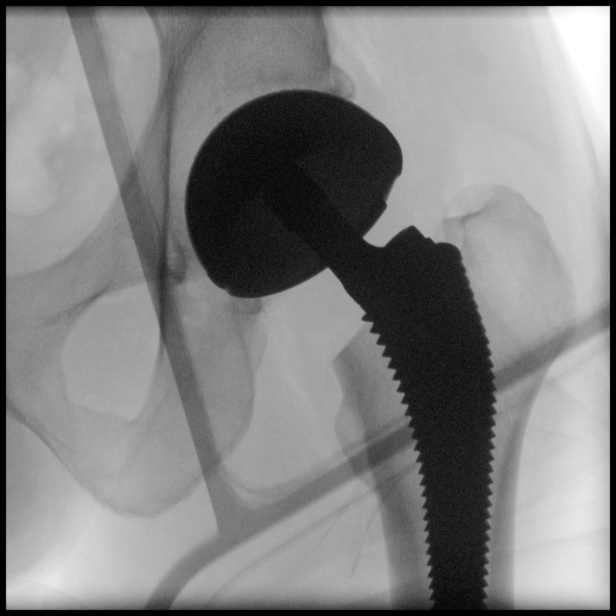

[Series 3: ortho standard · 4 of 9 frames shown (3 of 4)]
[frame 2/9]
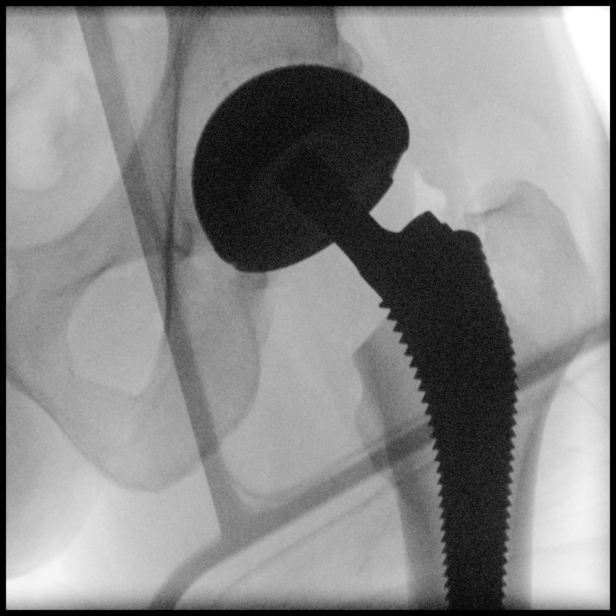
[frame 5/9]
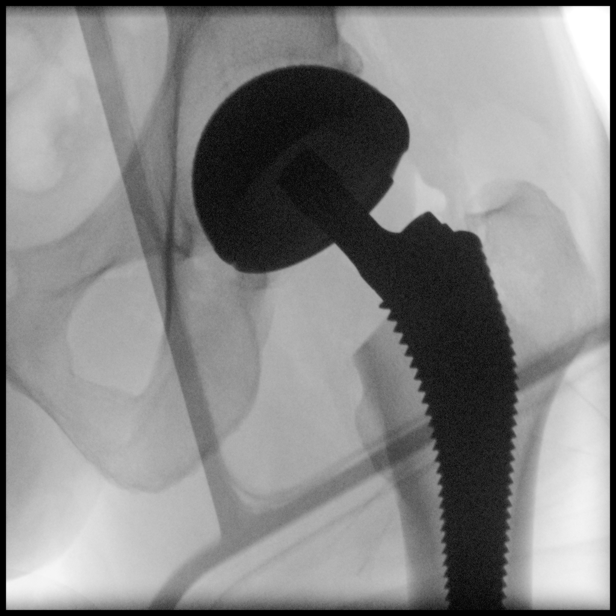
[frame 8/9]
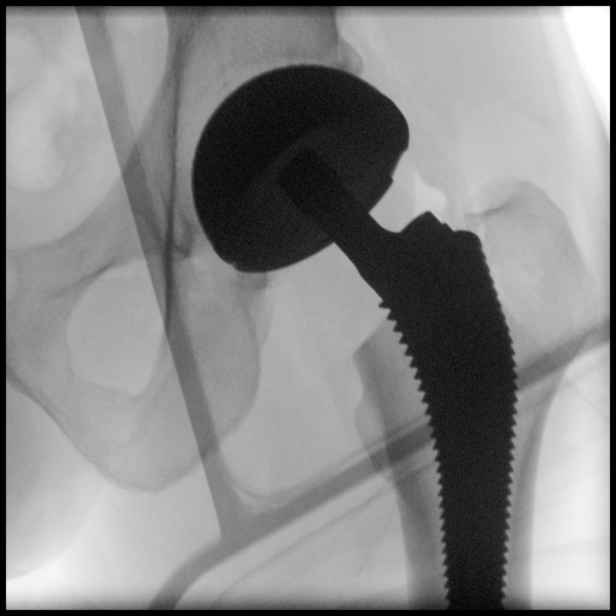
[frame 9/9]
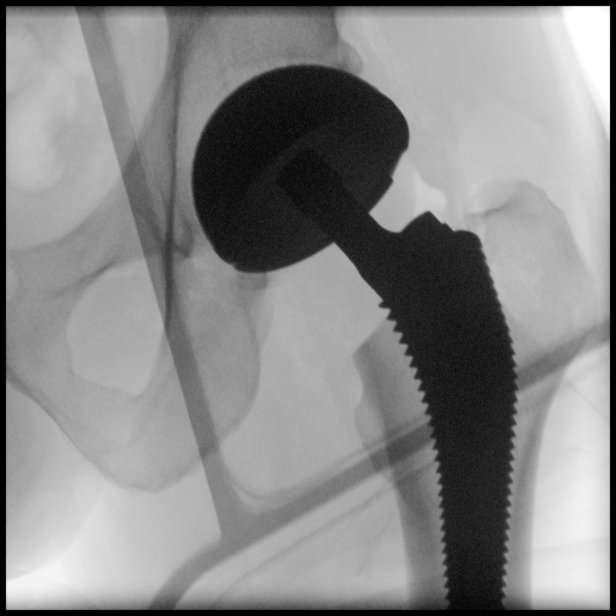

[Series 4: ortho standard · 4 of 9 frames shown (4 of 4)]
[frame 2/9]
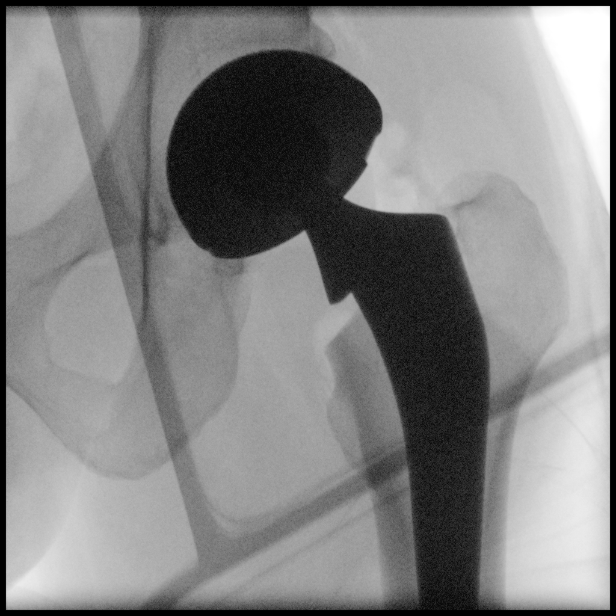
[frame 5/9]
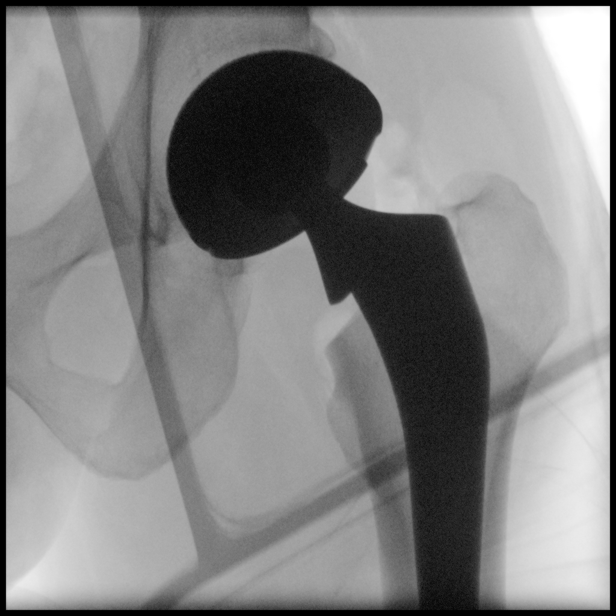
[frame 7/9]
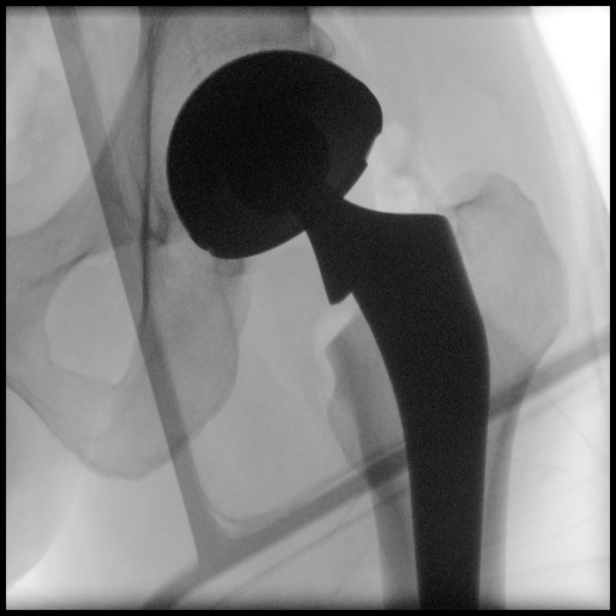
[frame 8/9]
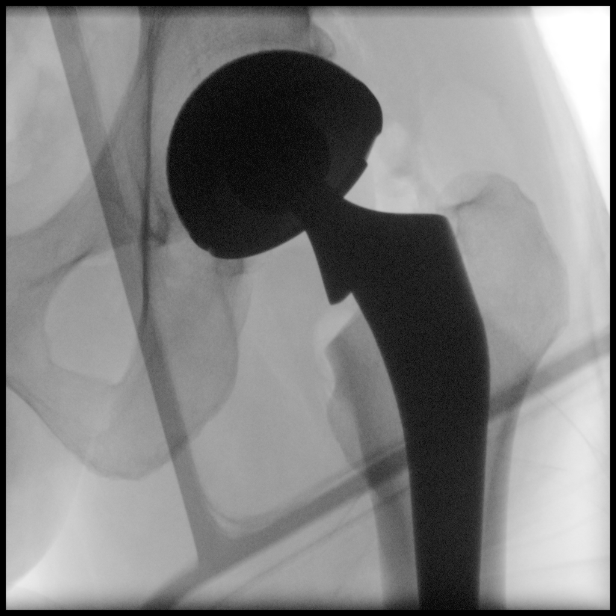

[15 of 16 positions shown; findings below may reference images not displayed]

FINDINGS: Intraoperative images during left hip arthroplasty. Hardware
alignment is normal without evidence of periprosthetic fracture.
IMPRESSION: Intraoperative images during left hip arthroplasty. No evidence of
immediate hardware complication.

## 2023-05-02 DIAGNOSIS — E291 Testicular hypofunction: Secondary | ICD-10-CM | POA: Diagnosis not present

## 2023-05-02 DIAGNOSIS — E039 Hypothyroidism, unspecified: Secondary | ICD-10-CM | POA: Diagnosis not present

## 2023-05-02 DIAGNOSIS — E559 Vitamin D deficiency, unspecified: Secondary | ICD-10-CM | POA: Diagnosis not present

## 2023-05-02 DIAGNOSIS — E236 Other disorders of pituitary gland: Secondary | ICD-10-CM | POA: Diagnosis not present

## 2023-05-11 DIAGNOSIS — G4452 New daily persistent headache (NDPH): Secondary | ICD-10-CM | POA: Diagnosis not present

## 2023-05-11 DIAGNOSIS — M791 Myalgia, unspecified site: Secondary | ICD-10-CM | POA: Diagnosis not present

## 2023-05-11 DIAGNOSIS — M542 Cervicalgia: Secondary | ICD-10-CM | POA: Diagnosis not present

## 2023-05-11 DIAGNOSIS — G518 Other disorders of facial nerve: Secondary | ICD-10-CM | POA: Diagnosis not present

## 2023-05-30 DIAGNOSIS — G518 Other disorders of facial nerve: Secondary | ICD-10-CM | POA: Diagnosis not present

## 2023-05-30 DIAGNOSIS — M791 Myalgia, unspecified site: Secondary | ICD-10-CM | POA: Diagnosis not present

## 2023-05-30 DIAGNOSIS — G4452 New daily persistent headache (NDPH): Secondary | ICD-10-CM | POA: Diagnosis not present

## 2023-05-30 DIAGNOSIS — M542 Cervicalgia: Secondary | ICD-10-CM | POA: Diagnosis not present

## 2023-06-19 DIAGNOSIS — G4452 New daily persistent headache (NDPH): Secondary | ICD-10-CM | POA: Diagnosis not present

## 2023-06-19 DIAGNOSIS — G518 Other disorders of facial nerve: Secondary | ICD-10-CM | POA: Diagnosis not present

## 2023-06-19 DIAGNOSIS — M542 Cervicalgia: Secondary | ICD-10-CM | POA: Diagnosis not present

## 2023-06-19 DIAGNOSIS — M791 Myalgia, unspecified site: Secondary | ICD-10-CM | POA: Diagnosis not present

## 2023-07-31 DIAGNOSIS — G518 Other disorders of facial nerve: Secondary | ICD-10-CM | POA: Diagnosis not present

## 2023-07-31 DIAGNOSIS — M542 Cervicalgia: Secondary | ICD-10-CM | POA: Diagnosis not present

## 2023-07-31 DIAGNOSIS — M791 Myalgia, unspecified site: Secondary | ICD-10-CM | POA: Diagnosis not present

## 2023-07-31 DIAGNOSIS — G4452 New daily persistent headache (NDPH): Secondary | ICD-10-CM | POA: Diagnosis not present

## 2023-09-01 DIAGNOSIS — E782 Mixed hyperlipidemia: Secondary | ICD-10-CM | POA: Diagnosis not present

## 2023-09-08 DIAGNOSIS — E236 Other disorders of pituitary gland: Secondary | ICD-10-CM | POA: Diagnosis not present

## 2023-09-08 DIAGNOSIS — E039 Hypothyroidism, unspecified: Secondary | ICD-10-CM | POA: Diagnosis not present

## 2023-09-08 DIAGNOSIS — E559 Vitamin D deficiency, unspecified: Secondary | ICD-10-CM | POA: Diagnosis not present

## 2023-09-08 DIAGNOSIS — E291 Testicular hypofunction: Secondary | ICD-10-CM | POA: Diagnosis not present

## 2023-09-11 DIAGNOSIS — M791 Myalgia, unspecified site: Secondary | ICD-10-CM | POA: Diagnosis not present

## 2023-09-11 DIAGNOSIS — G518 Other disorders of facial nerve: Secondary | ICD-10-CM | POA: Diagnosis not present

## 2023-09-11 DIAGNOSIS — G4452 New daily persistent headache (NDPH): Secondary | ICD-10-CM | POA: Diagnosis not present

## 2023-09-11 DIAGNOSIS — M542 Cervicalgia: Secondary | ICD-10-CM | POA: Diagnosis not present

## 2023-10-26 DIAGNOSIS — R7303 Prediabetes: Secondary | ICD-10-CM | POA: Diagnosis not present

## 2023-10-26 DIAGNOSIS — E039 Hypothyroidism, unspecified: Secondary | ICD-10-CM | POA: Diagnosis not present

## 2023-10-26 DIAGNOSIS — Z125 Encounter for screening for malignant neoplasm of prostate: Secondary | ICD-10-CM | POA: Diagnosis not present

## 2023-10-26 DIAGNOSIS — N182 Chronic kidney disease, stage 2 (mild): Secondary | ICD-10-CM | POA: Diagnosis not present

## 2023-10-26 DIAGNOSIS — E782 Mixed hyperlipidemia: Secondary | ICD-10-CM | POA: Diagnosis not present

## 2023-10-26 DIAGNOSIS — Z0000 Encounter for general adult medical examination without abnormal findings: Secondary | ICD-10-CM | POA: Diagnosis not present

## 2023-11-21 DIAGNOSIS — Z23 Encounter for immunization: Secondary | ICD-10-CM | POA: Diagnosis not present

## 2023-11-21 DIAGNOSIS — I700 Atherosclerosis of aorta: Secondary | ICD-10-CM | POA: Diagnosis not present

## 2023-11-21 DIAGNOSIS — Z0000 Encounter for general adult medical examination without abnormal findings: Secondary | ICD-10-CM | POA: Diagnosis not present

## 2023-11-21 DIAGNOSIS — E236 Other disorders of pituitary gland: Secondary | ICD-10-CM | POA: Diagnosis not present

## 2023-11-21 DIAGNOSIS — E782 Mixed hyperlipidemia: Secondary | ICD-10-CM | POA: Diagnosis not present
